# Patient Record
Sex: Female | Born: 1940 | Race: White | Hispanic: No | Marital: Married | Smoking: Never smoker
Health system: Southern US, Community
[De-identification: ages and names within clinical notes are randomized; demographics above are authoritative.]

## PROBLEM LIST (undated history)

## (undated) DIAGNOSIS — F32A Depression, unspecified: Secondary | ICD-10-CM

## (undated) DIAGNOSIS — IMO0002 Reserved for concepts with insufficient information to code with codable children: Secondary | ICD-10-CM

## (undated) DIAGNOSIS — Z923 Personal history of irradiation: Secondary | ICD-10-CM

## (undated) DIAGNOSIS — Z8719 Personal history of other diseases of the digestive system: Secondary | ICD-10-CM

## (undated) DIAGNOSIS — E7800 Pure hypercholesterolemia, unspecified: Secondary | ICD-10-CM

## (undated) DIAGNOSIS — E039 Hypothyroidism, unspecified: Secondary | ICD-10-CM

## (undated) DIAGNOSIS — R229 Localized swelling, mass and lump, unspecified: Secondary | ICD-10-CM

## (undated) DIAGNOSIS — K219 Gastro-esophageal reflux disease without esophagitis: Secondary | ICD-10-CM

## (undated) DIAGNOSIS — M1990 Unspecified osteoarthritis, unspecified site: Secondary | ICD-10-CM

## (undated) DIAGNOSIS — I209 Angina pectoris, unspecified: Secondary | ICD-10-CM

## (undated) DIAGNOSIS — F419 Anxiety disorder, unspecified: Secondary | ICD-10-CM

## (undated) DIAGNOSIS — R112 Nausea with vomiting, unspecified: Secondary | ICD-10-CM

## (undated) DIAGNOSIS — C50919 Malignant neoplasm of unspecified site of unspecified female breast: Secondary | ICD-10-CM

## (undated) DIAGNOSIS — F329 Major depressive disorder, single episode, unspecified: Secondary | ICD-10-CM

## (undated) DIAGNOSIS — I10 Essential (primary) hypertension: Secondary | ICD-10-CM

## (undated) DIAGNOSIS — Z9889 Other specified postprocedural states: Secondary | ICD-10-CM

## (undated) HISTORY — PX: TONSILLECTOMY: SUR1361

## (undated) HISTORY — DX: Localized swelling, mass and lump, unspecified: R22.9

## (undated) HISTORY — DX: Essential (primary) hypertension: I10

## (undated) HISTORY — PX: HEMORRHOID SURGERY: SHX153

## (undated) HISTORY — PX: BILATERAL KNEE ARTHROSCOPY: SUR91

## (undated) HISTORY — DX: Pure hypercholesterolemia, unspecified: E78.00

## (undated) HISTORY — DX: Hypothyroidism, unspecified: E03.9

## (undated) HISTORY — PX: JOINT REPLACEMENT: SHX530

## (undated) HISTORY — DX: Unspecified osteoarthritis, unspecified site: M19.90

## (undated) HISTORY — DX: Anxiety disorder, unspecified: F41.9

## (undated) HISTORY — DX: Reserved for concepts with insufficient information to code with codable children: IMO0002

## (undated) HISTORY — DX: Major depressive disorder, single episode, unspecified: F32.9

## (undated) HISTORY — PX: BACK SURGERY: SHX140

## (undated) HISTORY — PX: PARTIAL HYSTERECTOMY: SHX80

## (undated) HISTORY — PX: BREAST BIOPSY: SHX20

## (undated) HISTORY — DX: Gastro-esophageal reflux disease without esophagitis: K21.9

## (undated) HISTORY — DX: Depression, unspecified: F32.A

## (undated) HISTORY — PX: TONSILLECTOMY: SHX5217

---

## 1998-01-24 ENCOUNTER — Ambulatory Visit (HOSPITAL_COMMUNITY): Admission: RE | Admit: 1998-01-24 | Discharge: 1998-01-24 | Payer: Self-pay | Admitting: Cardiovascular Disease

## 1999-04-25 ENCOUNTER — Other Ambulatory Visit: Admission: RE | Admit: 1999-04-25 | Discharge: 1999-04-25 | Payer: Self-pay | Admitting: Obstetrics & Gynecology

## 2000-10-23 ENCOUNTER — Ambulatory Visit: Admission: RE | Admit: 2000-10-23 | Discharge: 2000-10-23 | Payer: Self-pay | Admitting: Specialist

## 2000-10-23 ENCOUNTER — Encounter: Payer: Self-pay | Admitting: Specialist

## 2000-11-26 ENCOUNTER — Encounter: Payer: Self-pay | Admitting: Specialist

## 2000-11-26 ENCOUNTER — Inpatient Hospital Stay (HOSPITAL_COMMUNITY): Admission: RE | Admit: 2000-11-26 | Discharge: 2000-11-30 | Payer: Self-pay | Admitting: Specialist

## 2001-04-13 ENCOUNTER — Other Ambulatory Visit: Admission: RE | Admit: 2001-04-13 | Discharge: 2001-04-13 | Payer: Self-pay | Admitting: Dermatology

## 2001-11-23 ENCOUNTER — Other Ambulatory Visit: Admission: RE | Admit: 2001-11-23 | Discharge: 2001-11-23 | Payer: Self-pay | Admitting: Dermatology

## 2002-02-26 ENCOUNTER — Encounter: Payer: Self-pay | Admitting: Otolaryngology

## 2002-02-28 ENCOUNTER — Emergency Department (HOSPITAL_COMMUNITY): Admission: EM | Admit: 2002-02-28 | Discharge: 2002-02-28 | Payer: Self-pay | Admitting: Emergency Medicine

## 2002-03-02 ENCOUNTER — Encounter: Payer: Self-pay | Admitting: Otolaryngology

## 2002-03-02 ENCOUNTER — Ambulatory Visit (HOSPITAL_COMMUNITY): Admission: RE | Admit: 2002-03-02 | Discharge: 2002-03-03 | Payer: Self-pay | Admitting: Otolaryngology

## 2002-04-13 ENCOUNTER — Encounter: Payer: Self-pay | Admitting: Specialist

## 2002-04-13 ENCOUNTER — Ambulatory Visit (HOSPITAL_COMMUNITY): Admission: RE | Admit: 2002-04-13 | Discharge: 2002-04-13 | Payer: Self-pay | Admitting: Specialist

## 2002-07-07 ENCOUNTER — Ambulatory Visit (HOSPITAL_COMMUNITY): Admission: RE | Admit: 2002-07-07 | Discharge: 2002-07-07 | Payer: Self-pay | Admitting: Specialist

## 2002-10-08 ENCOUNTER — Encounter: Payer: Self-pay | Admitting: Family Medicine

## 2002-10-08 ENCOUNTER — Encounter: Admission: RE | Admit: 2002-10-08 | Discharge: 2002-10-08 | Payer: Self-pay | Admitting: Family Medicine

## 2003-04-08 ENCOUNTER — Encounter: Payer: Self-pay | Admitting: Specialist

## 2003-04-09 ENCOUNTER — Inpatient Hospital Stay (HOSPITAL_COMMUNITY): Admission: RE | Admit: 2003-04-09 | Discharge: 2003-04-10 | Payer: Self-pay | Admitting: Specialist

## 2003-09-02 ENCOUNTER — Encounter: Admission: RE | Admit: 2003-09-02 | Discharge: 2003-09-02 | Payer: Self-pay | Admitting: Family Medicine

## 2003-09-07 ENCOUNTER — Other Ambulatory Visit: Admission: RE | Admit: 2003-09-07 | Discharge: 2003-09-07 | Payer: Self-pay | Admitting: Obstetrics & Gynecology

## 2004-05-08 ENCOUNTER — Encounter: Admission: RE | Admit: 2004-05-08 | Discharge: 2004-08-06 | Payer: Self-pay | Admitting: Family Medicine

## 2004-12-31 ENCOUNTER — Encounter: Admission: RE | Admit: 2004-12-31 | Discharge: 2004-12-31 | Payer: Self-pay | Admitting: Family Medicine

## 2005-08-07 ENCOUNTER — Encounter: Admission: RE | Admit: 2005-08-07 | Discharge: 2005-08-07 | Payer: Self-pay | Admitting: Family Medicine

## 2006-04-18 ENCOUNTER — Inpatient Hospital Stay (HOSPITAL_COMMUNITY): Admission: RE | Admit: 2006-04-18 | Discharge: 2006-04-22 | Payer: Self-pay | Admitting: Specialist

## 2006-05-20 ENCOUNTER — Encounter: Admission: RE | Admit: 2006-05-20 | Discharge: 2006-06-16 | Payer: Self-pay | Admitting: Specialist

## 2006-06-17 ENCOUNTER — Encounter: Admission: RE | Admit: 2006-06-17 | Discharge: 2006-09-09 | Payer: Self-pay | Admitting: Specialist

## 2006-10-20 ENCOUNTER — Encounter: Admission: RE | Admit: 2006-10-20 | Discharge: 2006-10-20 | Payer: Self-pay | Admitting: Family Medicine

## 2009-02-24 HISTORY — PX: CARDIOVASCULAR STRESS TEST: SHX262

## 2009-02-27 HISTORY — PX: US ECHOCARDIOGRAPHY: HXRAD669

## 2011-02-14 ENCOUNTER — Telehealth: Payer: Self-pay | Admitting: Cardiovascular Disease

## 2011-02-14 NOTE — Telephone Encounter (Signed)
Wants to come in for cardiac eval. She said that she had some sx's of chest burning and jaw pain in the past and she wants to get it evaluated.  The last time she was seen was in 2010. Dr.Nahser does not have any 30 min.openings until first week of Oct.

## 2011-02-14 NOTE — Telephone Encounter (Signed)
Pt given first same day dr Lauro Regulus is in office and told to go to er with anymore CP,Pt verbalized understanding. Corwin Levins RN

## 2011-02-15 ENCOUNTER — Encounter: Payer: Self-pay | Admitting: Cardiovascular Disease

## 2011-02-20 ENCOUNTER — Encounter: Payer: Self-pay | Admitting: Cardiovascular Disease

## 2011-02-20 ENCOUNTER — Ambulatory Visit (INDEPENDENT_AMBULATORY_CARE_PROVIDER_SITE_OTHER): Payer: Medicare Other | Admitting: Cardiovascular Disease

## 2011-02-20 DIAGNOSIS — I10 Essential (primary) hypertension: Secondary | ICD-10-CM

## 2011-02-20 DIAGNOSIS — R079 Chest pain, unspecified: Secondary | ICD-10-CM | POA: Insufficient documentation

## 2011-02-20 NOTE — Assessment & Plan Note (Signed)
Her blood pressure is a little elevated today. Her daughter commented that she still eats lots of salty foods. We will have her decrease her salt intake. We will continue with her same medications since the most time her blood pressure is normal.

## 2011-02-20 NOTE — Assessment & Plan Note (Signed)
She presents with some chest and jaw pain. It is quite possible that this represents angina. She's had a Lexiscan study in the past which was negative. Will repeat the myoview.

## 2011-02-20 NOTE — Progress Notes (Signed)
Laura Cruz Date of Birth  [birth year] [facility] [id number]     [place], [place]  [address] [phone]  Fax  [phone]  History of Present Illness:  Laura Cruz is a 70 year old female with a history of chest heaviness. Her original consultation was in September 2010 when she presented with chest heaviness. She had a Lexiscan Cardiolite which was negative for ischemia. She has normal left ventricular systolic function. She had an echocardiogram that revealed normal left ventricular systolic function. She has moderate to severe left ventricular hypertrophy. She has some diastolic dysfunction.  She recently developed an episode of jaw pain and chest pain. The pains subsided when she took the sublingual nitroglycerin.  Pains lasted about 15 minutes and resolved after she took a second nitroglycerin.  She was doing housework at the time.  She has a history of a hiatal hernia.  Her blood pressures typically in the normal range.  Her daughter came with her to the appointment today and stated that she still eats a lot of salt.  Current Outpatient Prescriptions on File Prior to Visit  Medication Sig Dispense Refill  . amLODipine (NORVASC) 2.5 MG tablet Take 2.5 mg by mouth daily.        Marland Kitchen aspirin 325 MG tablet Take 325 mg by mouth daily.        Marland Kitchen atenolol (TENORMIN) 50 MG tablet Take 50 mg by mouth daily.        Marland Kitchen buPROPion (WELLBUTRIN XL) 300 MG 24 hr tablet Take 300 mg by mouth daily.        Marland Kitchen estradiol (ESTRACE) 0.5 MG tablet Take 0.5 mg by mouth daily.        Marland Kitchen levothyroxine (SYNTHROID, LEVOTHROID) 125 MCG tablet Take 125 mcg by mouth daily.        Marland Kitchen lisinopril-hydrochlorothiazide (PRINZIDE,ZESTORETIC) 20-12.5 MG per tablet Take 1 tablet by mouth daily.        Marland Kitchen omeprazole (PRILOSEC) 20 MG capsule Take 20 mg by mouth daily.        . simvastatin (ZOCOR) 40 MG tablet Take 10 mg by mouth at bedtime.       . traMADol (ULTRAM) 50 MG tablet  Take 50 mg by mouth every 6 (six) hours as needed.        . traZODone (DESYREL) 100 MG tablet Take 100 mg by mouth at bedtime.       Marland Kitchen HYDROcodone-acetaminophen (NORCO) 7.5-325 MG per tablet Take 1 tablet by mouth every 6 (six) hours as needed.         Not on File  Past Medical History  Diagnosis Date  . Hypertension   . Diabetes mellitus   . Hypercholesteremia   . Arthritis   . Anxiety   . Depression   . Gastroesophageal reflux disease   . Hypothyroidism   . Hiatal hernia   . GERD (gastroesophageal reflux disease)   . Osteoarthritis   . Mass     Left ear, benign    Past Surgical History  Procedure Date  . Bilateral knee arthroscopy 2000, June 2007    Replacements  . Tonsillectomy   . Partial hysterectomy   . Hemorrhoid surgery   . Back surgery     2, pinched nerve  . US echocardiography 02-27-2009    Est EF 55-60%  . Cardiovascular stress test 02-24-2009    EF 68%    History  Smoking status  . Never Smoker   Smokeless tobacco  .  Not on file    History  Alcohol Use No    Family History  Problem Relation Age of Onset  . Diabetes Mother   . Hypertension Father   . Stroke Father   . Prostate cancer Father   . Coronary artery disease Father   . Hypertension Sister   . Hypertension Brother   . Hypertension Brother   . Lung cancer Sister     Reviw of Systems:  Reviewed in the HPI.  All other systems are negative.  Physical Exam: BP 164/88  Pulse 63  Ht 5\' 4"  (1.626 m)  Wt 148 lb 3.2 oz (67.223 kg)  BMI 25.44 kg/m2 The patient is alert and oriented x 3.  The mood and affect are normal.   Skin: warm and dry.  Color is normal.    HEENT:   the sclera are nonicteric.  The mucous membranes are moist.  The carotids are 2+ without bruits.  There is no thyromegaly.  There is no JVD.    Lungs: clear.  The chest wall is non tender.    Heart: regular rate with a normal S1 and S2.  There are no murmurs, gallops, or rubs. The PMI is not displaced.      Abdomen: good bowel sounds.  There is no guarding or rebound.  There is no hepatosplenomegaly or tenderness.  There are no masses.   Extremities:  no clubbing, cyanosis, or edema.  The legs are without rashes.  The distal pulses are intact.   Neuro:  Cranial nerves II - XII are intact.  Motor and sensory functions are intact.    The gait is normal.  ECG: Normal sinus rhythm. She has left ventricular hypertrophy with repolarization of her body. There are no changes from a previous EKG.  Assessment / Plan:

## 2011-02-25 ENCOUNTER — Encounter: Payer: Self-pay | Admitting: *Deleted

## 2011-03-05 ENCOUNTER — Ambulatory Visit (HOSPITAL_COMMUNITY): Payer: Medicare Other | Attending: Cardiovascular Disease | Admitting: Radiology

## 2011-03-05 DIAGNOSIS — R079 Chest pain, unspecified: Secondary | ICD-10-CM

## 2011-03-05 DIAGNOSIS — R0789 Other chest pain: Secondary | ICD-10-CM

## 2011-03-05 MED ORDER — REGADENOSON 0.4 MG/5ML IV SOLN
0.4000 mg | Freq: Once | INTRAVENOUS | Status: AC
  Administered 2011-03-05: 0.4 mg via INTRAVENOUS

## 2011-03-05 MED ORDER — TECHNETIUM TC 99M TETROFOSMIN IV KIT
11.0000 | PACK | Freq: Once | INTRAVENOUS | Status: AC | PRN
  Administered 2011-03-05: 11 via INTRAVENOUS

## 2011-03-05 MED ORDER — TECHNETIUM TC 99M TETROFOSMIN IV KIT
33.0000 | PACK | Freq: Once | INTRAVENOUS | Status: AC | PRN
  Administered 2011-03-05: 33 via INTRAVENOUS

## 2011-03-05 NOTE — Progress Notes (Signed)
[place] [address] [phone]  Cardiology Nuclear Med Study  Laura Cruz is a 70 y.o. female [id number] [birth year]   Nuclear Med Background Indication for Stress Test:  Evaluation for Ischemia History:  '10 [id number], EF=68%; '10 Echo:EF=55-60% Cardiac Risk Factors: Family History - CAD, Hypertension, Lipids and NIDDM  Symptoms:  Chest Pain/Pressure and Jaw Pain with and without Exertion (last episode of chest discomfort was about 2-3 weeks ago), Diaphoresis, Fatigue and Rapid HR   Nuclear Pre-Procedure Caffeine/Decaff Intake:  none NPO After: 6:00pm   Lungs:  Clear.  O2 Sat 97% on RA. IV 0.9% NS with Angio Cath:  20g  IV Site: R Forearm  IV Started by:  Eliezer Lofts, EMT-P  Chest Size (in):  42 Cup Size: D  Height:    Weight:     BMI:  There is no height or weight on file to calculate BMI. Tech Comments:  Atenolol held x 48 hours    Nuclear Med Study 1 or 2 day study: 1 day  Stress Test Type:  Carlton Adam  Reading MD: Mertie Moores, MD  Order Authorizing Provider:  Mertie Moores, MD  Resting Radionuclide: Technetium 49m Tetrofosmin  Resting Radionuclide Dose: 11.0 mCi   Stress Radionuclide:  Technetium 34m Tetrofosmin  Stress Radionuclide Dose: 33.0 mCi           Stress Protocol Rest HR: 69 Stress HR: 90  Rest BP: 163/86 Stress BP: 160/92  Exercise Time (min): n/a METS: n/a   Predicted Max HR: 149 bpm % Max HR: 60.4 bpm Rate Pressure Product: 14400   Dose of Adenosine (mg):  n/a Dose of Lexiscan: 0.4 mg  Dose of Atropine (mg): n/a Dose of Dobutamine: n/a mcg/kg/min (at max HR)  Stress Test Technologist: Letta Moynahan, CMA-N  Nuclear Technologist:  Charlton Amor, CNMT     Rest Procedure:  Myocardial perfusion imaging was performed at rest 45 minutes following the intravenous administration of Technetium 59m Tetrofosmin.  Rest ECG: Mild LVH with strain.  Stress Procedure:  The patient  received IV Lexiscan 0.4 mg over 15-seconds.  Technetium 56m Tetrofosmin injected at 30-seconds.  There were no significant changes with Lexiscan, occasional PVC's.  She did c/o chest tightness with infusion.  Quantitative spect images were obtained after a 45 minute delay.  Stress ECG: No significant change from baseline ECG  QPS Raw Data Images:  Normal; no motion artifact; normal heart/lung ratio. Stress Images:  There is mild apical thinning with normal uptake in other regions. Rest Images:  There is mild apical thinning with normal uptake in other regions. Subtraction (SDS):  No evidence of ischemia. Transient Ischemic Dilatation (Normal <1.22):  1.21 Lung/Heart Ratio (Normal <0.45):  0.27  Quantitative Gated Spect Images QGS EDV:  63 ml QGS ESV:  23 ml QGS cine images:  NL LV Function; NL Wall Motion QGS EF: 63%  Impression Exercise Capacity:  Lexiscan with no exercise. BP Response:  Normal blood pressure response. Clinical Symptoms:  No chest pain. ECG Impression:  No significant ST segment change suggestive of ischemia. Comparison with Prior Nuclear Study: No images to compare  Overall Impression:  Normal stress nuclear study.  No evidence of ischemia.  Normal LV function.    Thayer Headings, Laura Bonito., MD, [facility]

## 2011-04-05 ENCOUNTER — Ambulatory Visit: Payer: Medicare Other | Admitting: Cardiovascular Disease

## 2011-04-26 ENCOUNTER — Encounter: Payer: Self-pay | Admitting: Cardiovascular Disease

## 2011-04-26 ENCOUNTER — Ambulatory Visit (INDEPENDENT_AMBULATORY_CARE_PROVIDER_SITE_OTHER): Payer: Medicare Other | Admitting: Cardiovascular Disease

## 2011-04-26 DIAGNOSIS — R079 Chest pain, unspecified: Secondary | ICD-10-CM

## 2011-04-26 NOTE — Patient Instructions (Signed)
Your physician wants you to follow-up in: 6 month  You will receive a reminder letter in the mail two months in advance. If you don't receive a letter, please call our office to schedule the follow-up appointment.  Try maylanta for abd/chest pain, call office if no help.

## 2011-04-26 NOTE — Progress Notes (Signed)
Laura Cruz Date of Birth  [birth year] [facility] [address]    [place], [place]  [address] [phone]  Fax  [phone]  History of Present Illness:  Laura Cruz is a 70 year old female with a history of chest pains in the past. She has a hiatal hernia. Had a negative Myoview study back in 2010 and repeat normal Myoview study 2000 well. She continues to have intermittent episodes of chest discomfort. The pain starts as a burning on the right side of her chest.  She had an episode last night. She took a nitroglycerin and the pressure gradually eased up over the course of about 15 minutes.  She walks on a regular basis.  She's never had any episodes of angina when she walks. She does have some shortness of breath with exertion.  Current Outpatient Prescriptions on File Prior to Visit  Medication Sig Dispense Refill  . amLODipine (NORVASC) 2.5 MG tablet Take 2.5 mg by mouth daily.        Marland Kitchen aspirin 325 MG tablet Take 325 mg by mouth daily.        Marland Kitchen atenolol (TENORMIN) 50 MG tablet Take 50 mg by mouth daily.        Marland Kitchen buPROPion (WELLBUTRIN XL) 300 MG 24 hr tablet Take 300 mg by mouth daily.        Marland Kitchen estradiol (ESTRACE) 0.5 MG tablet Take 0.5 mg by mouth daily.        Marland Kitchen glucose blood test strip 1 each by Other route. Use as instructed       . Lancets Misc. (UNISTIK 2 NORMAL) MISC by Does not apply route. Lancet Once a Day DX 250.00       . levothyroxine (SYNTHROID, LEVOTHROID) 125 MCG tablet Take 125 mcg by mouth daily.        Marland Kitchen lisinopril-hydrochlorothiazide (PRINZIDE,ZESTORETIC) 20-12.5 MG per tablet Take 1 tablet by mouth daily.        . nitroGLYCERIN (NITROSTAT) 0.4 MG SL tablet Place 0.4 mg under the tongue every 5 (five) minutes as needed.        Marland Kitchen omeprazole (PRILOSEC) 20 MG capsule Take 20 mg by mouth daily.        . OXYCODONE HCL PO Take 10 mg by mouth as needed.        . sertraline (ZOLOFT) 100 MG tablet Take 100 mg by mouth daily.        . traMADol (ULTRAM) 50  MG tablet Take 50 mg by mouth every 6 (six) hours as needed.        . traZODone (DESYREL) 100 MG tablet Take 100 mg by mouth at bedtime.         Allergies  Allergen Reactions  . Morphine And Related     Past Medical History  Diagnosis Date  . Hypertension   . Diabetes mellitus   . Hypercholesteremia   . Arthritis   . Anxiety   . Depression   . Gastroesophageal reflux disease   . Hypothyroidism   . Hiatal hernia   . GERD (gastroesophageal reflux disease)   . Osteoarthritis   . Mass     Left ear, benign    Past Surgical History  Procedure Date  . Bilateral knee arthroscopy 2000, June 2007    Replacements  . Tonsillectomy   . Partial hysterectomy   . Hemorrhoid surgery   . Back surgery     2, pinched nerve  . US echocardiography 02-27-2009    Est EF  55-60%  . Cardiovascular stress test 02-24-2009    EF 68%    History  Smoking status  . Never Smoker   Smokeless tobacco  . Not on file    History  Alcohol Use No    Family History  Problem Relation Age of Onset  . Diabetes Mother   . Hypertension Father   . Stroke Father   . Prostate cancer Father   . Coronary artery disease Father   . Hypertension Sister   . Hypertension Brother   . Hypertension Brother   . Lung cancer Sister     Reviw of Systems:  Reviewed in the HPI.  All other systems are negative.  Physical Exam: BP 153/77  Pulse 64  Ht 5\' 4"  (1.626 m)  Wt 151 lb 12.8 oz (68.856 kg)  BMI 26.06 kg/m2 The patient is alert and oriented x 3.  The mood and affect are normal.   Skin: warm and dry.  Color is normal.    HEENT:   [place]/AT, no JVD, normal carotids  Lungs: clear   Heart: RR    Abdomen: +BS, nontender  Extremities:  No c/c/e  Neuro:  Non focal    Assessment / Plan:

## 2011-04-26 NOTE — Assessment & Plan Note (Addendum)
Laura Cruz continues to have chest pain.  She has had 2 normal myoview studies.  She has a hiatal hernia.  I have suggested that she try mylanta for her next episode of CP. I have offered to do heart catheterization if she continues to have episodes of chest pain.

## 2011-06-21 ENCOUNTER — Other Ambulatory Visit: Payer: Self-pay | Admitting: Family Medicine

## 2011-06-21 DIAGNOSIS — R222 Localized swelling, mass and lump, trunk: Secondary | ICD-10-CM

## 2011-06-24 ENCOUNTER — Ambulatory Visit
Admission: RE | Admit: 2011-06-24 | Discharge: 2011-06-24 | Disposition: A | Discharge status: Home / Self Care | Payer: Medicare Other | Source: Ambulatory Visit | Attending: Family Medicine | Admitting: Family Medicine

## 2011-06-24 DIAGNOSIS — R222 Localized swelling, mass and lump, trunk: Secondary | ICD-10-CM

## 2011-07-22 ENCOUNTER — Other Ambulatory Visit: Payer: Self-pay | Admitting: Otolaryngology

## 2011-07-22 DIAGNOSIS — M542 Cervicalgia: Secondary | ICD-10-CM

## 2011-07-25 ENCOUNTER — Ambulatory Visit
Admission: RE | Admit: 2011-07-25 | Discharge: 2011-07-25 | Disposition: A | Discharge status: Home / Self Care | Payer: Medicare Other | Source: Ambulatory Visit | Attending: Otolaryngology | Admitting: Otolaryngology

## 2011-07-25 DIAGNOSIS — M542 Cervicalgia: Secondary | ICD-10-CM

## 2011-07-25 MED ORDER — IOHEXOL 300 MG/ML  SOLN
75.0000 mL | Freq: Once | INTRAMUSCULAR | Status: AC | PRN
  Administered 2011-07-25: 75 mL via INTRAVENOUS

## 2011-10-01 ENCOUNTER — Other Ambulatory Visit: Payer: Self-pay | Admitting: Obstetrics & Gynecology

## 2011-10-01 DIAGNOSIS — R928 Other abnormal and inconclusive findings on diagnostic imaging of breast: Secondary | ICD-10-CM

## 2011-10-08 ENCOUNTER — Ambulatory Visit
Admission: RE | Admit: 2011-10-08 | Discharge: 2011-10-08 | Disposition: A | Discharge status: Home / Self Care | Payer: Medicare Other | Source: Ambulatory Visit | Attending: Obstetrics & Gynecology | Admitting: Obstetrics & Gynecology

## 2011-10-08 ENCOUNTER — Other Ambulatory Visit: Payer: Self-pay | Admitting: Obstetrics & Gynecology

## 2011-10-08 DIAGNOSIS — R928 Other abnormal and inconclusive findings on diagnostic imaging of breast: Secondary | ICD-10-CM

## 2011-10-08 DIAGNOSIS — N6320 Unspecified lump in the left breast, unspecified quadrant: Secondary | ICD-10-CM

## 2011-10-14 ENCOUNTER — Ambulatory Visit
Admission: RE | Admit: 2011-10-14 | Discharge: 2011-10-14 | Disposition: A | Discharge status: Home / Self Care | Payer: Medicare Other | Source: Ambulatory Visit | Attending: Obstetrics & Gynecology | Admitting: Obstetrics & Gynecology

## 2011-10-14 ENCOUNTER — Other Ambulatory Visit: Payer: Self-pay | Admitting: Obstetrics & Gynecology

## 2011-10-14 DIAGNOSIS — N6320 Unspecified lump in the left breast, unspecified quadrant: Secondary | ICD-10-CM

## 2011-10-15 ENCOUNTER — Other Ambulatory Visit: Payer: Self-pay | Admitting: Obstetrics & Gynecology

## 2011-10-15 ENCOUNTER — Ambulatory Visit
Admission: RE | Admit: 2011-10-15 | Discharge: 2011-10-15 | Disposition: A | Discharge status: Home / Self Care | Payer: Medicare Other | Source: Ambulatory Visit | Attending: Obstetrics & Gynecology | Admitting: Obstetrics & Gynecology

## 2011-10-15 DIAGNOSIS — C50912 Malignant neoplasm of unspecified site of left female breast: Secondary | ICD-10-CM

## 2011-10-15 DIAGNOSIS — N6320 Unspecified lump in the left breast, unspecified quadrant: Secondary | ICD-10-CM

## 2011-10-16 DIAGNOSIS — C50919 Malignant neoplasm of unspecified site of unspecified female breast: Secondary | ICD-10-CM

## 2011-10-16 HISTORY — DX: Malignant neoplasm of unspecified site of unspecified female breast: C50.919

## 2011-10-21 ENCOUNTER — Ambulatory Visit
Admission: RE | Admit: 2011-10-21 | Discharge: 2011-10-21 | Disposition: A | Discharge status: Home / Self Care | Payer: Medicare Other | Source: Ambulatory Visit | Attending: Obstetrics & Gynecology | Admitting: Obstetrics & Gynecology

## 2011-10-21 DIAGNOSIS — C50912 Malignant neoplasm of unspecified site of left female breast: Secondary | ICD-10-CM

## 2011-10-21 MED ORDER — GADOBENATE DIMEGLUMINE 529 MG/ML IV SOLN
14.0000 mL | Freq: Once | INTRAVENOUS | Status: AC | PRN
  Administered 2011-10-21: 14 mL via INTRAVENOUS

## 2011-10-23 ENCOUNTER — Other Ambulatory Visit: Payer: Self-pay | Admitting: Oncology

## 2011-10-23 ENCOUNTER — Encounter: Payer: Self-pay | Admitting: Specialist

## 2011-10-23 ENCOUNTER — Ambulatory Visit
Admission: RE | Admit: 2011-10-23 | Discharge: 2011-10-23 | Disposition: A | Discharge status: Home / Self Care | Payer: Medicare Other | Source: Ambulatory Visit | Attending: Radiation Oncology | Admitting: Radiation Oncology

## 2011-10-23 ENCOUNTER — Encounter (INDEPENDENT_AMBULATORY_CARE_PROVIDER_SITE_OTHER): Payer: Self-pay | Admitting: Surgery

## 2011-10-23 ENCOUNTER — Encounter: Payer: Self-pay | Admitting: Radiation Oncology

## 2011-10-23 ENCOUNTER — Encounter: Payer: Self-pay | Admitting: *Deleted

## 2011-10-23 ENCOUNTER — Telehealth: Payer: Self-pay | Admitting: *Deleted

## 2011-10-23 ENCOUNTER — Other Ambulatory Visit (HOSPITAL_BASED_OUTPATIENT_CLINIC_OR_DEPARTMENT_OTHER): Payer: Medicare Other | Admitting: Lab

## 2011-10-23 ENCOUNTER — Ambulatory Visit (HOSPITAL_BASED_OUTPATIENT_CLINIC_OR_DEPARTMENT_OTHER): Payer: Medicare Other | Admitting: Surgery

## 2011-10-23 ENCOUNTER — Ambulatory Visit: Payer: Medicare Other | Attending: Surgery | Admitting: Physical Therapy

## 2011-10-23 ENCOUNTER — Ambulatory Visit: Payer: Medicare Other

## 2011-10-23 ENCOUNTER — Encounter: Payer: Self-pay | Admitting: Oncology

## 2011-10-23 ENCOUNTER — Ambulatory Visit (HOSPITAL_BASED_OUTPATIENT_CLINIC_OR_DEPARTMENT_OTHER): Payer: Medicare Other | Admitting: Oncology

## 2011-10-23 VITALS — BP 151/62 | HR 71 | Temp 97.9°F | Ht 62.5 in | Wt 156.5 lb

## 2011-10-23 DIAGNOSIS — K219 Gastro-esophageal reflux disease without esophagitis: Secondary | ICD-10-CM | POA: Insufficient documentation

## 2011-10-23 DIAGNOSIS — E119 Type 2 diabetes mellitus without complications: Secondary | ICD-10-CM | POA: Insufficient documentation

## 2011-10-23 DIAGNOSIS — E7800 Pure hypercholesterolemia, unspecified: Secondary | ICD-10-CM | POA: Insufficient documentation

## 2011-10-23 DIAGNOSIS — Z79899 Other long term (current) drug therapy: Secondary | ICD-10-CM | POA: Insufficient documentation

## 2011-10-23 DIAGNOSIS — Z510 Encounter for antineoplastic radiation therapy: Secondary | ICD-10-CM | POA: Insufficient documentation

## 2011-10-23 DIAGNOSIS — C50519 Malignant neoplasm of lower-outer quadrant of unspecified female breast: Secondary | ICD-10-CM

## 2011-10-23 DIAGNOSIS — Z90710 Acquired absence of both cervix and uterus: Secondary | ICD-10-CM | POA: Insufficient documentation

## 2011-10-23 DIAGNOSIS — C50912 Malignant neoplasm of unspecified site of left female breast: Secondary | ICD-10-CM | POA: Insufficient documentation

## 2011-10-23 DIAGNOSIS — I10 Essential (primary) hypertension: Secondary | ICD-10-CM | POA: Insufficient documentation

## 2011-10-23 DIAGNOSIS — IMO0001 Reserved for inherently not codable concepts without codable children: Secondary | ICD-10-CM | POA: Insufficient documentation

## 2011-10-23 DIAGNOSIS — Z801 Family history of malignant neoplasm of trachea, bronchus and lung: Secondary | ICD-10-CM

## 2011-10-23 DIAGNOSIS — R293 Abnormal posture: Secondary | ICD-10-CM | POA: Insufficient documentation

## 2011-10-23 DIAGNOSIS — C50919 Malignant neoplasm of unspecified site of unspecified female breast: Secondary | ICD-10-CM

## 2011-10-23 DIAGNOSIS — Z170 Estrogen receptor positive status [ER+]: Secondary | ICD-10-CM

## 2011-10-23 LAB — CBC WITH DIFFERENTIAL/PLATELET
Basophils Absolute: 0 10*3/uL (ref 0.0–0.1)
EOS%: 4.3 % (ref 0.0–7.0)
HCT: 36.4 % (ref 34.8–46.6)
HGB: 12.6 g/dL (ref 11.6–15.9)
MCH: 28.2 pg (ref 25.1–34.0)
MCV: 81.4 fL (ref 79.5–101.0)
MONO%: 9.5 % (ref 0.0–14.0)
NEUT%: 64.8 % (ref 38.4–76.8)
lymph#: 1.2 10*3/uL (ref 0.9–3.3)

## 2011-10-23 LAB — COMPREHENSIVE METABOLIC PANEL
AST: 23 U/L (ref 0–37)
Alkaline Phosphatase: 53 U/L (ref 39–117)
BUN: 20 mg/dL (ref 6–23)
Creatinine, Ser: 1.39 mg/dL — ABNORMAL HIGH (ref 0.50–1.10)

## 2011-10-23 NOTE — Telephone Encounter (Signed)
gave patient appointment for genetics counseling and one month appointment with dr.khan

## 2011-10-23 NOTE — Progress Notes (Signed)
Patient came in today as a new patient , patient has insurance at this time patient did not request or need any assistance patient will contact financial advocated if anything changes.

## 2011-10-23 NOTE — Progress Notes (Signed)
I met the patient and her family at the multidisciplinary clinic.  We reviewed the distress screening; most of her distress had been addressed by being at the clinic and receiving information about her treatment.  I told her about the support services available at the [facility] for Patient and Family Support.

## 2011-10-23 NOTE — Patient Instructions (Signed)
Lumpectomy, Breast Conserving Surgery A lumpectomy is breast surgery that removes only part of the breast. Another name used may be partial mastectomy. The amount removed varies. Make sure you understand how much of your breast will be removed. Reasons for a lumpectomy:  Any solid breast mass.   Grouped significant nodularity that may be confused with a solitary breast mass.  Lumpectomy is the most common form of breast cancer surgery today. The surgeon removes the portion of your breast which contains the tumor (cancer). This is the lump. Some normal tissue around the lump is also removed to be sure that all the tumor has been removed.  If cancer cells are found in the margins where the breast tissue was removed, your surgeon will do more surgery to remove the remaining cancer tissue. This is called re-excision surgery. Radiation and/or chemotherapy treatments are often given following a lumpectomy to kill any cancer cells that could possibly remain.  REASONS YOU MAY NOT BE ABLE TO HAVE BREAST CONSERVING SURGERY:  The tumor is located in more than one place.   Your breast is small and the tumor is large so the breast would be disfigured.   The entire tumor removal is not successful with a lumpectomy.   You cannot commit to a full course of chemotherapy, radiation therapy or are pregnant and cannot have radiation.   You have previously had radiation to the breast to treat cancer.  HOW A LUMPECTOMY IS PERFORMED If overnight nursing is not required following a biopsy, a lumpectomy can be performed as a same-day surgery. This can be done in a hospital, clinic, or surgical center. The anesthesia used will depend on your surgeon. They will discuss this with you. A general anesthetic keeps you sleeping through the procedure. LET YOUR CAREGIVERS KNOW ABOUT THE FOLLOWING:  Allergies   Medications taken including herbs, eye drops, over the counter medications, and creams.   Use of steroids (by  mouth or creams)   Previous problems with anesthetics or Novocaine.   Possibility of pregnancy, if this applies   History of blood clots (thrombophlebitis)   History of bleeding or blood problems.   Previous surgery   Other health problems  BEFORE THE PROCEDURE You should be present one hour prior to your procedure unless directed otherwise.  AFTER THE PROCEDURE  After surgery, you will be taken to the recovery area where a nurse will watch and check your progress. Once you're awake, stable, and taking fluids well, barring other problems you will be allowed to go home.   Ice packs applied to your operative site may help with discomfort and keep the swelling down.   A small rubber drain may be placed in the breast for a couple of days to prevent a hematoma from developing in the breast.   A pressure dressing may be applied for 24 to 48 hours to prevent bleeding.   Keep the wound dry.   You may resume a normal diet and activities as directed. Avoid strenuous activities affecting the arm on the side of the biopsy site such as tennis, swimming, heavy lifting (more than 10 pounds) or pulling.   Bruising in the breast is normal following this procedure.   Wearing a bra - even to bed - may be more comfortable and also help keep the dressing on.   Change dressings as directed.   Only take over-the-counter or prescription medicines for pain, discomfort, or fever as directed by your caregiver.  Call for your results as  instructed by your surgeon. Remember it is your responsibility to get the results of your lumpectomy if your surgeon asked you to follow-up. Do not assume everything is fine if you have not heard from your caregiver. SEEK MEDICAL CARE IF:   There is increased bleeding (more than a small spot) from the wound.   You notice redness, swelling, or increasing pain in the wound.   Pus is coming from wound.   An unexplained oral temperature above 102 F (38.9 C) develops.     You notice a foul smell coming from the wound or dressing.  SEEK IMMEDIATE MEDICAL CARE IF:   You develop a rash.   You have difficulty breathing.   You have any allergic problems.  Document Released: 07/15/2006 Document Revised: 05/23/2011 Document Reviewed: 10/16/2006 [facility] Patient Information 2012 [place].

## 2011-10-23 NOTE — Progress Notes (Signed)
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 Ki-67 3%  HISTORY OF PRESENT ILLNESS::Laura Cruz is a 71 y.o. female who was found on screening mammography to have a 9 mm lesion in her left breast. There was 5 mm on ultrasound. This was biopsied and the tissue demonstrated pathology as described above in her diagnosis. She underwent an MRI which showed a solitary 11 mm mass in the lower outer quadrant of the left breast. She is otherwise in her usual state of health.   PREVIOUS RADIATION THERAPY: No  PAST MEDICAL HISTORY:  has a past medical history of Hypertension; Diabetes mellitus; Hypercholesteremia; Arthritis; Anxiety; Depression; Gastroesophageal reflux disease; Hypothyroidism; Hiatal hernia; GERD (gastroesophageal reflux disease); Osteoarthritis; and Mass.  she reports a history of skin cancer over her nose which was excised in the past  PAST SURGICAL HISTORY: Past Surgical History  Procedure Date  . Bilateral knee arthroscopy 2000, June 2007    Replacements  . Tonsillectomy   . Partial hysterectomy   . Hemorrhoid surgery   . Back surgery     2, pinched nerve  . US echocardiography 02-27-2009    Est EF 55-60%  . Cardiovascular stress test 02-24-2009    EF 68%   Gynecologic history: the patient underwent hysterectomy at age 56 for heavy bleeding. She started having her menstrual cycles at age 70. She has had 2 children carried to term. She was on hormone replacement therapy for 15 years and stopped this on 10/16/2011.  FAMILY HISTORY: family history includes Coronary artery disease in her father; Diabetes in her mother; Hypertension in her brothers,  father, and sister; Lung cancer in her sister; Prostate cancer in her father; and Stroke in her father.  SOCIAL HISTORY:  reports that she has never smoked. She does not have any smokeless tobacco history on file. She reports that she does not drink alcohol or use illicit drugs.  ALLERGIES: Morphine and related and Cephalexin  MEDICATIONS:  Current Outpatient Prescriptions  Medication Sig Dispense Refill  . amLODipine (NORVASC) 2.5 MG tablet Take 2.5 mg by mouth daily.        Marland Kitchen aspirin 325 MG tablet Take 325 mg by mouth daily.        Marland Kitchen atenolol (TENORMIN) 50 MG tablet Take 50 mg by mouth daily.        Marland Kitchen atorvastatin (LIPITOR) 10 MG tablet Take 10 mg by mouth daily.        Marland Kitchen buPROPion (WELLBUTRIN XL) 300 MG 24 hr tablet Take 300 mg by mouth daily.        . cyclobenzaprine (FLEXERIL) 10 MG tablet Take 10 mg by mouth 3 (three) times daily as needed.      Marland Kitchen estradiol (ESTRACE) 0.5 MG tablet Take 0.5 mg by mouth daily.       Marland Kitchen glucose blood test strip 1 each by Other route. Use as instructed       . HYDROcodone-acetaminophen (NORCO) 7.5-325 MG per tablet Take 1 tablet by mouth every 8 (eight) hours as needed.      Marland Kitchen HYDROcodone-homatropine (HYCODAN) 5-1.5 MG/5ML syrup Take 5 mLs by mouth every 6 (six) hours as needed.      Marland Kitchen  Lancets Misc. (UNISTIK 2 NORMAL) MISC by Does not apply route. Lancet Once a Day DX 250.00       . levothyroxine (SYNTHROID, LEVOTHROID) 125 MCG tablet Take 125 mcg by mouth daily.        Marland Kitchen lisinopril-hydrochlorothiazide (PRINZIDE,ZESTORETIC) 20-12.5 MG per tablet Take 1 tablet by mouth daily.        . nitroGLYCERIN (NITROSTAT) 0.4 MG SL tablet Place 0.4 mg under the tongue every 5 (five) minutes as needed.        Marland Kitchen omeprazole (PRILOSEC) 20 MG capsule Take 20 mg by mouth daily.        . OXYCODONE HCL PO Take 10 mg by mouth every 6 (six) hours as needed.       . sertraline (ZOLOFT) 100 MG tablet Take 100 mg by mouth daily.        . simvastatin (ZOCOR) 10 MG tablet Take 10 mg  by mouth at bedtime.      . traMADol (ULTRAM) 50 MG tablet Take 50 mg by mouth every 6 (six) hours as needed.        . traZODone (DESYREL) 100 MG tablet Take 100 mg by mouth at bedtime.         REVIEW OF SYSTEMS:  A complete 14 point review of systems was obtained and reviewed with the patient. It is notable for that above.    PHYSICAL EXAM:  Blood pressure 131/62. Pulse 71. Temperature 97.9 degrees. Weight 156 pounds. 5 foot 2.5 inches. General: Alert and oriented, in no acute distress HEENT: Head is normocephalic. Pupils are equally round and reactive to light. Extraocular movements are intact. Oropharynx is clear. Neck: Neck is supple, no palpable cervical or supraclavicular lymphadenopathy. Heart: Regular in rate and rhythm with no murmurs, rubs, or gallops. Chest: Clear to auscultation bilaterally, with no rhonchi, wheezes, or rales. Abdomen: Soft, nontender, nondistended, with no rigidity or guarding. Extremities: No cyanosis or edema. Lymphatics: No concerning lymphadenopathy. Skin: No concerning lesions. Musculoskeletal: symmetric strength and muscle tone throughout. Neurologic: Cranial nerves II through XII are grossly intact. No obvious focalities. Speech is fluent. Coordination is intact. Psychiatric: Judgment and insight are intact. Affect is appropriate. Breasts: There is a palpable post biopsy hematoma in the lower outer quadrant of the left breast. There is associated ecchymosis throughout the lower outer quadrant. No other palpable lesions of concern in either breast nor any palpable adenopathy in the axillary region  LABORATORY DATA:  Lab Results  Component Value Date   WBC 5.8 10/23/2011   HGB 12.6 10/23/2011   HCT 36.4 10/23/2011   MCV 81.4 10/23/2011   PLT 127* 10/23/2011   CMP     Component Value Date/Time   NA 141 10/23/2011 1302   K 3.3* 10/23/2011 1302   CL 100 10/23/2011 1302   CO2 26 10/23/2011 1302   GLUCOSE 116* 10/23/2011 1302   BUN 20 10/23/2011 1302   CREATININE  1.39* 10/23/2011 1302   CALCIUM 9.5 10/23/2011 1302   PROT 6.6 10/23/2011 1302   ALBUMIN 4.0 10/23/2011 1302   AST 23 10/23/2011 1302   ALT 19 10/23/2011 1302   ALKPHOS 53 10/23/2011 1302   BILITOT 0.3 10/23/2011 1302    Pathology: as above    RADIOGRAPHY: US Breast Left  10/08/2011  *RADIOLOGY REPORT*  Clinical Data:  The patient returns for evaluation of a possible mass in the left lower outer quadrant noted on recent screening study dated 09/30/2011 from [place].  DIGITAL DIAGNOSTIC LEFT MAMMOGRAM  AND LEFT BREAST ULTRASOUND:  Comparison:  04/18/2010 and 03/29/2009 from [facility]  Findings:  Additional views confirm the presence of a small spiculated mass in the left lower outer quadrant posteriorly. Mammographically this measures approximately 9 x 6 x 9 mm.  On physical exam, no mass is palpated in the left lower outer quadrant  Ultrasound is performed, showing an irregularly marginated taller than wide solid mass at 4 o'clock 10 cm from the left nipple measuring 5 mm in size.  This has a hyperechoic rim.  Sonography of the left axilla demonstrates no abnormal axillary nodes.  The appearance is suspicious for invasive mammary carcinoma. Biopsy is recommended.  Ultrasound-guided core needle biopsy was discussed with the patient and she agreed with this plan.  IMPRESSION: Highly suspicious spiculated mass in the left lower outer quadrant at 4 o'clock.  Ultrasound-guided core needle biopsy is recommended and has been scheduled for 10/14/2011.  Results were telephoned to [place] at Dr. Gwynne Edinger office.  BI-RADS CATEGORY 5:  Highly suggestive of malignancy - appropriate action should be taken.  Original Report Authenticated By: Chaney Born, M.D.   Mr Breast Bilateral W Wo Contrast  10/21/2011  *RADIOLOGY REPORT*  Clinical Data: Recently diagnosed invasive ductal carcinoma, grade 1 in the 4 o'clock region of the left breast.  BILATERAL BREAST MRI WITH AND WITHOUT CONTRAST  Technique:  Multiplanar, multisequence MR images of both breasts were obtained prior to and following the intravenous administration of 70ml of multihance.  Three dimensional images were evaluated at the independent DynaCad workstation.  Comparison:  Mammograms dated 09/30/2011,  10/08/2011, 10/14/2011 and 04/18/2010  Findings: There is a moderate background parenchymal enhancement pattern.  The patient has a 2.6 cm post biopsy hematoma in the lower outer quadrant of the left breast with a thin rim of surrounding enhancement. This corresponds well with the post biopsy mammogram. Just lateral to the hematoma is an enhancing nodule measuring 10 x 7 x 11 mm associated with a clip artifact.  This corresponds with the known malignancy.  No other areas of abnormal enhancement are seen in the left breast.  There is no abnormal enhancement the right breast.  There is no enlarged axillary or internal mammary adenopathy.  IMPRESSION: 11 mm enhancing mass in the lower outer quadrant of the left breast corresponding well with the known malignancy.  Post biopsy hematoma.  Treatment planning is recommended.  THREE-DIMENSIONAL MR IMAGE RENDERING ON INDEPENDENT WORKSTATION:  Three-dimensional MR images were rendered by post-processing of the original MR data on an independent workstation.  The three- dimensional MR images were interpreted, and findings were reported in the accompanying complete MRI report for this study.  BI-RADS CATEGORY 6:  Known biopsy-proven malignancy - appropriate action should be taken.   .  Original Report Authenticated By: Jeanie Cooks. Miquel Dunn, M.D.   [place] Core Biopsy  10/14/2011  *RADIOLOGY REPORT*  Clinical Data:  Suspicious left breast mass  ULTRASOUND GUIDED VACUUM ASSISTED CORE BIOPSY OF THE LEFT BREAST  Comparison: With priors  I met with the patient and we discussed the procedure of ultrasound- guided biopsy, including benefits and alternatives.  We discussed the high likelihood of a successful procedure. We  discussed the risks of the procedure, including infection, bleeding, tissue injury, clip migration, and inadequate sampling.  Informed, written consent was given.  Using sterile technique, 2% lidocaine, ultrasound guidance, and a 12 gauge vacuum assisted needle, biopsy was performed of the mass in the 4 o'clock region of the left breast.  At the conclusion of the procedure, a tissue marker clip was deployed into the biopsy cavity.  Follow-up 2-view mammogram was performed and dictated separately.  IMPRESSION: Ultrasound-guided biopsy of the left breast.  No apparent complications.  Original Report Authenticated By: Jeanie Cooks. Miquel Dunn, M.D.   Mm Digital Diagnostic Unilat L  10/14/2011  *RADIOLOGY REPORT*  Clinical Data:  Suspicious left breast mass  DIGITAL DIAGNOSTIC LEFT MAMMOGRAM  Comparison:  With priors  Findings:  Films are performed following ultrasound guided biopsy of a mass in the 4 o'clock region of the left breast.  Mammographic images demonstrate there is a ribbon shaped InRad clip in the lateral aspect of the left breast.  IMPRESSION: Status post ultrasound-guided core biopsy of the left breast mass with pathology pending.  Original Report Authenticated By: Jeanie Cooks. Miquel Dunn, M.D.   Mm Digital Diagnostic Unilat L  10/08/2011  *RADIOLOGY REPORT*  Clinical Data:  The patient returns for evaluation of a possible mass in the left lower outer quadrant noted on recent screening study dated 09/30/2011 from [place].  DIGITAL DIAGNOSTIC LEFT MAMMOGRAM  AND LEFT BREAST ULTRASOUND:  Comparison:  04/18/2010 and 03/29/2009 from [facility]  Findings:  Additional views confirm the presence of a small spiculated mass in the left lower outer quadrant posteriorly. Mammographically this measures approximately 9 x 6 x 9 mm.  On physical exam, no mass is palpated in the left lower outer quadrant  Ultrasound is performed, showing an irregularly marginated taller than wide solid mass at 4 o'clock 10 cm from  the left nipple measuring 5 mm in size.  This has a hyperechoic rim.  Sonography of the left axilla demonstrates no abnormal axillary nodes.  The appearance is suspicious for invasive mammary carcinoma. Biopsy is recommended.  Ultrasound-guided core needle biopsy was discussed with the patient and she agreed with this plan.  IMPRESSION: Highly suspicious spiculated mass in the left lower outer quadrant at 4 o'clock.  Ultrasound-guided core needle biopsy is recommended and has been scheduled for 10/14/2011.  Results were telephoned to [place] at Dr. Gwynne Edinger office.  BI-RADS CATEGORY 5:  Highly suggestive of malignancy - appropriate action should be taken.  Original Report Authenticated By: Chaney Born, M.D.   Mm Radiologist Eval And Mgmt  10/15/2011  *RADIOLOGY REPORT*  ESTABLISHED PATIENT OFFICE VISIT - LEVEL II [phone])  Chief Complaint:  The patient returns for discussion of the pathologic findings after ultrasound-guided core needle biopsy of a 5 mm mass in the left lower outer quadrant on 10/14/2011.  History:  Patient's screening mammogram performed on 09/30/2011 demonstrated a small mass in the left lower outer quadrant. Additional views confirmed the presence of an irregular 5 mm mass at 4 o'clock 10 cm from the left nipple. Ultrasound-guided core needle biopsy was performed on 10/14/2011.  Exam:  Examination of the biopsy site demonstrates moderate ecchymosis with no sign of hematoma or infection.  The patient reports no complications from the procedure.  Assessment and Plan:  Histologic evaluation demonstrates invasive ductal carcinoma, grade 1.  This is concordant with the imaging findings.  Breast MRI is scheduled for 10/21/2011.  The patient is scheduled to be seen in the [facility] on 10/23/2011.  Results were discussed with the patient and her daughter.  Questions were answered.  Educational materials were given.  Original Report Authenticated By: Chaney Born, M.D.      IMPRESSION/PLAN:This is a very pleasant 71 year old woman with stage I, clinical T1 CN  0 M0 left breast cancer.   She has been discussed at our multidisciplinary tumor board.  The consensus is that she be a good candidate for breast conservation. I talked to her about the option of a mastectomy and informed her that her expected overall survival would be equivalent between mastectomy and breast conservation, based upon randomized controlled data. She is enthusiastic about breast conservation. She would like to receive treatment in [place], rather than [place], which is due [place] of where she lives.  She understands that radiotherapy is an effective modality to minimize her risk of an in-breast recurrence after lumpectomy. We discussed the risks, benefits, and side effects of radiotherapy. We discussed that radiation would take approximately 5 weeks to complete and that I would give the patient a few weeks to heal following surgery before starting treatment planning.  Based on her body habitus, I am not sure she would be a candidate for the [place] 3.5 week regimen. She will not likely need a boost treatment due to her age and favorable tumor characteristics. We spoke about acute effects including skin irritation and fatigue as well as much less common late effects including lung and heart irritation. We spoke about the latest technology that is used to minimize the risk of late effects for breast cancer patients undergoing radiotherapy. No guarantees of treatment were given. The patient is enthusiastic about proceeding with treatment. I look forward to participating in the patient's care.  Therefore, following lumpectomy and sentinel lymph node biopsy she'll be sent to me for radiation planning. After radiotherapy, she will undergo antiestrogen therapy. I have given the patient my contact information if she has any further questions following today's consultation.     -----------------------------------  Eppie Gibson, MD

## 2011-10-23 NOTE — Progress Notes (Signed)
Patient ID: Laura Cruz, female   DOB: 29-Jan-1941, 71 y.o.   MRN: [id number]  No chief complaint on file.   HPI Laura Cruz is a 71 y.o. female.  Pt presents with abnormal left mammogram.  1 cm mass noted on mammogram and MRI. Core biopsy showed invasive mammary carcinoma  ER POS PR POS  HER 2 NEU NEGATIVE.  Pt sent at the request of Dr Stann Mainland.  Denies any history of mass,  Drainage or breast change. HPI  Past Medical History  Diagnosis Date  . Hypertension   . Diabetes mellitus   . Hypercholesteremia   . Arthritis   . Anxiety   . Depression   . Gastroesophageal reflux disease   . Hypothyroidism   . Hiatal hernia   . GERD (gastroesophageal reflux disease)   . Osteoarthritis   . Mass     Left ear, benign    Past Surgical History  Procedure Date  . Bilateral knee arthroscopy 2000, June 2007    Replacements  . Tonsillectomy   . Partial hysterectomy   . Hemorrhoid surgery   . Back surgery     2, pinched nerve  . US echocardiography 02-27-2009    Est EF 55-60%  . Cardiovascular stress test 02-24-2009    EF 68%    Family History  Problem Relation Age of Onset  . Diabetes Mother   . Hypertension Father   . Stroke Father   . Prostate cancer Father   . Coronary artery disease Father   . Hypertension Sister   . Hypertension Brother   . Hypertension Brother   . Lung cancer Sister     Social History History  Substance Use Topics  . Smoking status: Never Smoker   . Smokeless tobacco: Not on file  . Alcohol Use: No    Allergies  Allergen Reactions  . Morphine And Related Other (See Comments)    "Makes me crazy."  . Cephalexin Rash    Current Outpatient Prescriptions  Medication Sig Dispense Refill  . amLODipine (NORVASC) 2.5 MG tablet Take 2.5 mg by mouth daily.        Marland Kitchen aspirin 325 MG tablet Take 325 mg by mouth daily.        Marland Kitchen atenolol (TENORMIN) 50 MG tablet Take 50 mg by mouth daily.        Marland Kitchen atorvastatin (LIPITOR) 10 MG tablet Take 10 mg by mouth  daily.        Marland Kitchen buPROPion (WELLBUTRIN XL) 300 MG 24 hr tablet Take 300 mg by mouth daily.        . cyclobenzaprine (FLEXERIL) 10 MG tablet Take 10 mg by mouth 3 (three) times daily as needed.      Marland Kitchen estradiol (ESTRACE) 0.5 MG tablet Take 0.5 mg by mouth daily.       Marland Kitchen glucose blood test strip 1 each by Other route. Use as instructed       . HYDROcodone-acetaminophen (NORCO) 7.5-325 MG per tablet Take 1 tablet by mouth every 8 (eight) hours as needed.      Marland Kitchen HYDROcodone-homatropine (HYCODAN) 5-1.5 MG/5ML syrup Take 5 mLs by mouth every 6 (six) hours as needed.      . Lancets Misc. (UNISTIK 2 NORMAL) MISC by Does not apply route. Lancet Once a Day DX 250.00       . levothyroxine (SYNTHROID, LEVOTHROID) 125 MCG tablet Take 125 mcg by mouth daily.        Marland Kitchen lisinopril-hydrochlorothiazide (PRINZIDE,ZESTORETIC) 20-12.5 MG per  tablet Take 1 tablet by mouth daily.        . nitroGLYCERIN (NITROSTAT) 0.4 MG SL tablet Place 0.4 mg under the tongue every 5 (five) minutes as needed.        Marland Kitchen omeprazole (PRILOSEC) 20 MG capsule Take 20 mg by mouth daily.        . OXYCODONE HCL PO Take 10 mg by mouth every 6 (six) hours as needed.       . sertraline (ZOLOFT) 100 MG tablet Take 100 mg by mouth daily.        . simvastatin (ZOCOR) 10 MG tablet Take 10 mg by mouth at bedtime.      . traMADol (ULTRAM) 50 MG tablet Take 50 mg by mouth every 6 (six) hours as needed.        . traZODone (DESYREL) 100 MG tablet Take 100 mg by mouth at bedtime.         Review of Systems Review of Systems  Constitutional: Negative for fever, chills and unexpected weight change.  HENT: Negative for hearing loss, congestion, sore throat, trouble swallowing and voice change.   Eyes: Negative for visual disturbance.  Respiratory: Negative for cough and wheezing.   Cardiovascular: Negative for chest pain, palpitations and leg swelling.  Gastrointestinal: Negative for nausea, vomiting, abdominal pain, diarrhea, constipation, blood in stool,  abdominal distention and anal bleeding.  Genitourinary: Negative for hematuria, vaginal bleeding and difficulty urinating.  Musculoskeletal: Negative for arthralgias.  Skin: Negative for rash and wound.  Neurological: Negative for seizures, syncope and headaches.  Hematological: Negative for adenopathy. Does not bruise/bleed easily.  Psychiatric/Behavioral: Negative for confusion.    There were no vitals taken for this visit.  Physical Exam Physical Exam  Constitutional: She is oriented to person, place, and time. She appears well-developed and well-nourished.  HENT:  Head: Normocephalic and atraumatic.  Eyes: EOM are normal. Pupils are equal, round, and reactive to light.  Neck: Normal range of motion. Neck supple.  Cardiovascular: Normal rate and regular rhythm.   Pulmonary/Chest: Effort normal and breath sounds normal.       Bruising left breast lower inner quadrant.  No mass.  Axilla normal.  Right breast normal.  Right axilla normal.  Abdominal: Soft. Bowel sounds are normal.  Musculoskeletal: Normal range of motion.  Neurological: She is alert and oriented to person, place, and time.  Skin: Skin is warm and dry.  Psychiatric: She has a normal mood and affect. Her behavior is normal. Judgment and thought content normal.    Data Reviewed Left breast mass 1 cm central lower breast  ER pos PR POS HER 2 NEU NEGATIVE    Assessment    STAGE 1 LEFT BREAST CANCER    Plan    PT DESIRES BREAST CONSERVATION.  The procedure has been discussed with the patient. Alternatives to surgery have been discussed with the patient.  Risks of surgery include bleeding,  Infection,  Seroma formation, death,  and the need for further surgery.   The patient understands and wishes to proceed.Sentinel lymph node mapping and dissection has been discussed with the patient.  Risk of bleeding,  Infection,  Seroma formation,  Additional procedures,,  Shoulder weakness ,  Shoulder stiffness,  Nerve and blood  vessel injury and reaction to the mapping dyes have been discussed.  Alternatives to surgery have been discussed with the patient.  The patient agrees to proceed with left breast lumpectomy and SLN mapping.       Adalynne Steffensmeier A. 10/23/2011,  2:46 PM

## 2011-10-23 NOTE — Progress Notes (Signed)
Laura Cruz [id number] 1941-05-19 71 y.o. 10/23/2011 2:40 PM  CC  [name], MD, MD [address] [place] [address] Dr. Eppie Gibson Dr. [name]  REASON FOR CONSULTATION:  71 year old female with 11 mm stage I invasive ductal carcinoma being seen in the multidisciplinary breast clinic for discussion of treatment options.  STAGE:   Stage 1 (T1No)  REFERRING PHYSICIAN: Dr. Marcello Moores Cornett  HISTORY OF PRESENT ILLNESS:  Laura Cruz is a 71 y.o. female.  With multiple medical problems including hypercholesterolemia chronic pain and arthritis as well as diabetes. Patient recently had a screening mammogram performed that showed a 9 mm left breast mass. She went on to have an ultrasound that only showed a 5 mm mass at the 4:00 position. She had a by core needle biopsy performed that showed an invasive ductal carcinoma grade 1 ER +100% PR +100% proliferation marker marker Ki-67 3% and HER-2/neu negative. Patient went on to have MRI of the breasts performed that showed a solitary 11 mm area. Radiographic staging T1 C. NX MX. Patient hasn't now seen in the multidisciplinary breast clinic for discussion of treatment options. She is without any acute complaints.   Past Medical History: Past Medical History  Diagnosis Date  . Hypertension   . Diabetes mellitus   . Hypercholesteremia   . Arthritis   . Anxiety   . Depression   . Gastroesophageal reflux disease   . Hypothyroidism   . Hiatal hernia   . GERD (gastroesophageal reflux disease)   . Osteoarthritis   . Mass     Left ear, benign    Past Surgical History: Past Surgical History  Procedure Date  . Bilateral knee arthroscopy 2000, June 2007    Replacements  . Tonsillectomy   . Partial hysterectomy   . Hemorrhoid surgery   . Back surgery     2, pinched nerve  . US echocardiography 02-27-2009    Est EF 55-60%  . Cardiovascular stress test 02-24-2009    EF 68%    Family History: Family  History  Problem Relation Age of Onset  . Diabetes Mother   . Hypertension Father   . Stroke Father   . Prostate cancer Father   . Coronary artery disease Father   . Hypertension Sister   . Hypertension Brother   . Hypertension Brother   . Lung cancer Sister     Social History History  Substance Use Topics  . Smoking status: Never Smoker   . Smokeless tobacco: Not on file  . Alcohol Use: No    Allergies: Allergies  Allergen Reactions  . Morphine And Related Other (See Comments)    "Makes me crazy."  . Cephalexin Rash    Current Medications: Current Outpatient Prescriptions  Medication Sig Dispense Refill  . amLODipine (NORVASC) 2.5 MG tablet Take 2.5 mg by mouth daily.        Marland Kitchen aspirin 325 MG tablet Take 325 mg by mouth daily.        Marland Kitchen atenolol (TENORMIN) 50 MG tablet Take 50 mg by mouth daily.        Marland Kitchen atorvastatin (LIPITOR) 10 MG tablet Take 10 mg by mouth daily.        Marland Kitchen buPROPion (WELLBUTRIN XL) 300 MG 24 hr tablet Take 300 mg by mouth daily.        . cyclobenzaprine (FLEXERIL) 10 MG tablet Take 10 mg by mouth 3 (three) times daily as needed.      Marland Kitchen glucose  blood test strip 1 each by Other route. Use as instructed       . HYDROcodone-acetaminophen (NORCO) 7.5-325 MG per tablet Take 1 tablet by mouth every 8 (eight) hours as needed.      Marland Kitchen HYDROcodone-homatropine (HYCODAN) 5-1.5 MG/5ML syrup Take 5 mLs by mouth every 6 (six) hours as needed.      . Lancets Misc. (UNISTIK 2 NORMAL) MISC by Does not apply route. Lancet Once a Day DX 250.00       . levothyroxine (SYNTHROID, LEVOTHROID) 125 MCG tablet Take 125 mcg by mouth daily.        Marland Kitchen lisinopril-hydrochlorothiazide (PRINZIDE,ZESTORETIC) 20-12.5 MG per tablet Take 1 tablet by mouth daily.        . nitroGLYCERIN (NITROSTAT) 0.4 MG SL tablet Place 0.4 mg under the tongue every 5 (five) minutes as needed.        Marland Kitchen omeprazole (PRILOSEC) 20 MG capsule Take 20 mg by mouth daily.        . OXYCODONE HCL PO Take 10 mg by mouth  every 6 (six) hours as needed.       . sertraline (ZOLOFT) 100 MG tablet Take 100 mg by mouth daily.        . simvastatin (ZOCOR) 10 MG tablet Take 10 mg by mouth at bedtime.      . traMADol (ULTRAM) 50 MG tablet Take 50 mg by mouth every 6 (six) hours as needed.        . traZODone (DESYREL) 100 MG tablet Take 100 mg by mouth at bedtime.       Marland Kitchen estradiol (ESTRACE) 0.5 MG tablet Take 0.5 mg by mouth daily.         OB/GYN History:Menarche at age 60 patient is postmenopausal she was on hormone replacement therapy for about 15 years she discontinued it on 10/16/2011. Patient is G2 P2 her first live birth was at 24 she has completed all of her family.  Fertility Discussion: Patient has completed her family Prior History of Cancer:Patient did have some skin cancer removed from her nose by Dr. Nevada Crane in 2011.  Health Maintenance: The patient had a colonoscopy about 3 years ago in 2010. She had a bone density performed about 2 years ago 2011. Her last Pap smear was in April 2013. Her first mammogram began at the age of 19 and yearly thereafter.  ECOG PERFORMANCE STATUS: 0 - Asymptomatic  Genetic Counseling/testing: Genetic counseling and what and testing was discussed with the patient After obtaining a complete family history. A referral to the genetic counselor has been made. REVIEW OF SYSTEMS:  Ears, nose, mouth, throat, and face: negative Respiratory: negative Cardiovascular: negative Gastrointestinal: negative Genitourinary:negative Integument/breast: negative Hematologic/lymphatic: negative Musculoskeletal:positive for arthralgias, back pain, myalgias and stiff joints Neurological: negative Behavioral/Psych: positive for anxiety  PHYSICAL EXAMINATION: Blood pressure 151/62, pulse 71, temperature 97.9 F (36.6 C), temperature source Oral, height 5' 2.5" (1.588 m), weight 156 lb 8 oz (70.988 kg).  [id number], no distress, well nourished, well developed and anxious SKIN: skin color,  texture, turgor are normal HEAD: Normocephalic EYES: PERRLA, EOMI, sclera clear EARS: External ears normal OROPHARYNX:no exudate, no erythema and lips, buccal mucosa, and tongue normal  NECK: supple, no adenopathy LYMPH:  no palpable lymphadenopathy, no hepatosplenomegaly BREAST:Bilateral breast examination was performed the left breast revealed area of ecchymosis from her recent biopsy and possibly a slight fullness do to hematoma. No nipple discharge no other masses. Right breast no masses or nipple discharge. LUNGS: clear to auscultation and  percussion HEART: regular rate & rhythm, no murmurs and no gallops ABDOMEN:abdomen soft, non-tender, normal bowel sounds and no masses or organomegaly BACK: Back symmetric, no curvature., No CVA tenderness EXTREMITIES:no edema, no clubbing, no cyanosis  NEURO: alert & oriented x 3 with fluent speech, no focal motor/sensory deficits, gait normal, reflexes normal and symmetric   STUDIES/RESULTS: US Breast Left  Oct 21, 2011  *RADIOLOGY REPORT*  Clinical Data:  The patient returns for evaluation of a possible mass in the left lower outer quadrant noted on recent screening study dated 09/30/2011 from [facility].  DIGITAL DIAGNOSTIC LEFT MAMMOGRAM  AND LEFT BREAST ULTRASOUND:  Comparison:  04/18/2010 and 03/29/2009 from [facility]  Findings:  Additional views confirm the presence of a small spiculated mass in the left lower outer quadrant posteriorly. Mammographically this measures approximately 9 x 6 x 9 mm.  On physical exam, no mass is palpated in the left lower outer quadrant  Ultrasound is performed, showing an irregularly marginated taller than wide solid mass at 4 o'clock 10 cm from the left nipple measuring 5 mm in size.  This has a hyperechoic rim.  Sonography of the left axilla demonstrates no abnormal axillary nodes.  The appearance is suspicious for invasive mammary carcinoma. Biopsy is recommended.  Ultrasound-guided core needle  biopsy was discussed with the patient and she agreed with this plan.  IMPRESSION: Highly suspicious spiculated mass in the left lower outer quadrant at 4 o'clock.  Ultrasound-guided core needle biopsy is recommended and has been scheduled for 10/14/2011.  Results were telephoned to [place] at Dr. Gwynne Edinger office.  BI-RADS CATEGORY 5:  Highly suggestive of malignancy - appropriate action should be taken.  Original Report Authenticated By: Chaney Born, M.D.   Mr Breast Bilateral W Wo Contrast  10/21/2011  *RADIOLOGY REPORT*  Clinical Data: Recently diagnosed invasive ductal carcinoma, grade 1 in the 4 o'clock region of the left breast.  BILATERAL BREAST MRI WITH AND WITHOUT CONTRAST  Technique: Multiplanar, multisequence MR images of both breasts were obtained prior to and following the intravenous administration of 52ml of multihance.  Three dimensional images were evaluated at the independent DynaCad workstation.  Comparison:  Mammograms dated 09/30/2011,  10-21-2011, 10/14/2011 and 04/18/2010  Findings: There is a moderate background parenchymal enhancement pattern.  The patient has a 2.6 cm post biopsy hematoma in the lower outer quadrant of the left breast with a thin rim of surrounding enhancement. This corresponds well with the post biopsy mammogram. Just lateral to the hematoma is an enhancing nodule measuring 10 x 7 x 11 mm associated with a clip artifact.  This corresponds with the known malignancy.  No other areas of abnormal enhancement are seen in the left breast.  There is no abnormal enhancement the right breast.  There is no enlarged axillary or internal mammary adenopathy.  IMPRESSION: 11 mm enhancing mass in the lower outer quadrant of the left breast corresponding well with the known malignancy.  Post biopsy hematoma.  Treatment planning is recommended.  THREE-DIMENSIONAL MR IMAGE RENDERING ON INDEPENDENT WORKSTATION:  Three-dimensional MR images were rendered by post-processing of the original  MR data on an independent workstation.  The three- dimensional MR images were interpreted, and findings were reported in the accompanying complete MRI report for this study.  BI-RADS CATEGORY 6:  Known biopsy-proven malignancy - appropriate action should be taken.   .  Original Report Authenticated By: Jeanie Cooks. Miquel Dunn, M.D.   [place] Core Biopsy  10/14/2011  *RADIOLOGY REPORT*  Clinical Data:  Suspicious left breast mass  ULTRASOUND GUIDED VACUUM ASSISTED CORE BIOPSY OF THE LEFT BREAST  Comparison: With priors  I met with the patient and we discussed the procedure of ultrasound- guided biopsy, including benefits and alternatives.  We discussed the high likelihood of a successful procedure. We discussed the risks of the procedure, including infection, bleeding, tissue injury, clip migration, and inadequate sampling.  Informed, written consent was given.  Using sterile technique, 2% lidocaine, ultrasound guidance, and a 12 gauge vacuum assisted needle, biopsy was performed of the mass in the 4 o'clock region of the left breast.  At the conclusion of the procedure, a tissue marker clip was deployed into the biopsy cavity.  Follow-up 2-view mammogram was performed and dictated separately.  IMPRESSION: Ultrasound-guided biopsy of the left breast.  No apparent complications.  Original Report Authenticated By: Jeanie Cooks. Miquel Dunn, M.D.   Mm Digital Diagnostic Unilat L  10/14/2011  *RADIOLOGY REPORT*  Clinical Data:  Suspicious left breast mass  DIGITAL DIAGNOSTIC LEFT MAMMOGRAM  Comparison:  With priors  Findings:  Films are performed following ultrasound guided biopsy of a mass in the 4 o'clock region of the left breast.  Mammographic images demonstrate there is a ribbon shaped InRad clip in the lateral aspect of the left breast.  IMPRESSION: Status post ultrasound-guided core biopsy of the left breast mass with pathology pending.  Original Report Authenticated By: Jeanie Cooks. Miquel Dunn, M.D.   Mm Digital Diagnostic Unilat  L  10/08/2011  *RADIOLOGY REPORT*  Clinical Data:  The patient returns for evaluation of a possible mass in the left lower outer quadrant noted on recent screening study dated 09/30/2011 from [place].  DIGITAL DIAGNOSTIC LEFT MAMMOGRAM  AND LEFT BREAST ULTRASOUND:  Comparison:  04/18/2010 and 03/29/2009 from [facility]  Findings:  Additional views confirm the presence of a small spiculated mass in the left lower outer quadrant posteriorly. Mammographically this measures approximately 9 x 6 x 9 mm.  On physical exam, no mass is palpated in the left lower outer quadrant  Ultrasound is performed, showing an irregularly marginated taller than wide solid mass at 4 o'clock 10 cm from the left nipple measuring 5 mm in size.  This has a hyperechoic rim.  Sonography of the left axilla demonstrates no abnormal axillary nodes.  The appearance is suspicious for invasive mammary carcinoma. Biopsy is recommended.  Ultrasound-guided core needle biopsy was discussed with the patient and she agreed with this plan.  IMPRESSION: Highly suspicious spiculated mass in the left lower outer quadrant at 4 o'clock.  Ultrasound-guided core needle biopsy is recommended and has been scheduled for 10/14/2011.  Results were telephoned to [place] at Dr. Gwynne Edinger office.  BI-RADS CATEGORY 5:  Highly suggestive of malignancy - appropriate action should be taken.  Original Report Authenticated By: Chaney Born, M.D.   Mm Radiologist Eval And Mgmt  10/15/2011  *RADIOLOGY REPORT*  ESTABLISHED PATIENT OFFICE VISIT - LEVEL II [phone])  Chief Complaint:  The patient returns for discussion of the pathologic findings after ultrasound-guided core needle biopsy of a 5 mm mass in the left lower outer quadrant on 10/14/2011.  History:  Patient's screening mammogram performed on 09/30/2011 demonstrated a small mass in the left lower outer quadrant. Additional views confirmed the presence of an irregular 5 mm mass at 4 o'clock 10 cm from  the left nipple. Ultrasound-guided core needle biopsy was performed on 10/14/2011.  Exam:  Examination of the biopsy site demonstrates moderate ecchymosis with no sign of hematoma  or infection.  The patient reports no complications from the procedure.  Assessment and Plan:  Histologic evaluation demonstrates invasive ductal carcinoma, grade 1.  This is concordant with the imaging findings.  Breast MRI is scheduled for 10/21/2011.  The patient is scheduled to be seen in the [facility] on 10/23/2011.  Results were discussed with the patient and her daughter.  Questions were answered.  Educational materials were given.  Original Report Authenticated By: Chaney Born, M.D.     LABS:    Chemistry      Component Value Date/Time   NA 141 10/23/2011 1302   K 3.3* 10/23/2011 1302   CL 100 10/23/2011 1302   CO2 26 10/23/2011 1302   BUN 20 10/23/2011 1302   CREATININE 1.39* 10/23/2011 1302      Component Value Date/Time   CALCIUM 9.5 10/23/2011 1302   ALKPHOS 53 10/23/2011 1302   AST 23 10/23/2011 1302   ALT 19 10/23/2011 1302   BILITOT 0.3 10/23/2011 1302      Lab Results  Component Value Date   WBC 5.8 10/23/2011   HGB 12.6 10/23/2011   HCT 36.4 10/23/2011   MCV 81.4 10/23/2011   PLT 127* 10/23/2011       PATHOLOGY: ADDITIONAL INFORMATION: PROGNOSTIC INDICATORS - ACIS Results IMMUNOHISTOCHEMICAL AND MORPHOMETRIC ANALYSIS BY THE AUTOMATED CELLULAR IMAGING SYSTEM (ACIS) Estrogen Receptor (Negative, <1%): 100%,POSITIVE, STRONG STAINING INTENSITY Progesterone Receptor (Negative, <1%): 100%,POSITIVE, STRONG STAINING INTENSITY Proliferation Marker Ki67 by M IB-1 (Low<20%): 3% All controls stained appropriately Aldona Bar MD Pathologist, Electronic Signature ( Signed 10/18/2011) CHROMOGENIC IN-SITU HYBRIDIZATION Interpretation HER-2/NEU BY CISH - NO AMPLIFICATION OF HER-2 DETECTED. THE RATIO OF HER-2: CEP 17 SIGNALS WAS 1.59. Reference range: Ratio: HER2:CEP17 <  1.8 - gene amplification not observed Ratio: HER2:CEP 17 1.8-2.2 - equivocal result Ratio: HER2:CEP17 > 2.2 - gene amplification observed Aldona Bar MD Pathologist, Electronic Signature ( Signed 10/17/2011) 1 of 2 FINAL for Laura Cruz, Laura Cruz ([id number]) FINAL DIAGNOSIS Diagnosis Breast, left, needle core biopsy, 4 o'clock - INVASIVE DUCTAL CARCINOMA, GRADE I, SEE COMMENT. Microscopic Comment Although the grade of tumor is best assessed at resection, with these biopsy, the invasive tumor is grade I. Breast prognostic studies are pending and will be reported in an addendum. The case is reviewed with Dr. Sallee Provencal who concurs. (CR:mw 10-15-11) Mali RUND DO Pathologist, Electronic Signature  ASSESSMENT    71 year old female with new diagnosis of stage I invasive ductal carcinoma of the left breast. Patient presented with a abnormal screening mammogram that showed a 9 mm mass subsequent bleeding ultrasound confirmed this and an MRI showed a solitary 11 mm area with it radiate graphic staging of T1 C. NX MX. Patient underwent a core needle biopsy that showed invasive ductal carcinoma grade 1 ER +100% PR +100% HER-2/neu negative with a proliferation marker Ki-67 at 3%. Patient is now seen in the multidisciplinary breast clinic for discussion of treatment options. Her case was discussed at the multidisciplinary breast clinic. She was seen by radiation oncology as well as surgery. I saw the patient and discussed adjuvant treatments. For this individual with a favorable disease I do think that she would do quite well with just adjuvant antiestrogen therapy with an aromatase inhibitor. We discussed the risks and benefits. I discussed her for pathology in detail and the rationale for her management. She demonstrated understanding.  PLAN:    #1 patient will proceed with lumpectomy with sentinel node biopsy. She will be set  up to have this done by Dr. Brantley Stage. His office will be calling her with a  schedule.  #2 once patient has her surgery I will plan on seeing her back and we will discuss her final pathology. If it remains favorable and she has no other findings that she would certainly be a candidate for postlumpectomy radiation therapy. This will be done prior to her beginning any kind of systemic treatment.  #3 the patient and I and her family discussed adjuvant antiestrogen therapy. My recommendation would be an aromatase inhibitor. This certainly could consist of either Arimidex or Aromasin or letrozole. Risks and benefits of these drugs were discussed with him briefly.These recommendations are based on NCCN guidelines for stage I ER positive breast cancer  #4 patient will be seen back to see me in about 4-5 weeks time.  #5 based on patient's family history I did recommend genetic counseling and possibly testing. And a referral has been made       Thank you so much for allowing me to participate in the care of [place]. I will continue to follow up the patient with you and assist in her care.  All questions were answered. The patient knows to call the clinic with any problems, questions or concerns. We can certainly see the patient much sooner if necessary.  I spent 60 minutes counseling the patient face to face. The total time spent in the appointment was 60 minutes.  Marcy Panning, MD Medical/Oncology [facility] [phone] (beeper) [phone] (Office)  10/23/2011, 2:40 PM 10/23/2011, 2:40 PM

## 2011-10-24 ENCOUNTER — Encounter: Payer: Self-pay | Admitting: *Deleted

## 2011-10-24 NOTE — Progress Notes (Signed)
Mailed after appt letter to pt. 

## 2011-10-31 ENCOUNTER — Telehealth: Payer: Self-pay | Admitting: *Deleted

## 2011-10-31 NOTE — Telephone Encounter (Signed)
Left vm for pt to return call concerning [place] from 10/23/11.

## 2011-10-31 NOTE — Telephone Encounter (Signed)
Spoke to pt concerning [place] from 5/8.  Pt denies questions or concerns regarding dx or treatment care plan.  Confirmed appts for surgery and f/u with Dr. Humphrey Rolls.  Informed pt she will be receiving a call to be scheduled for f/u with Dr. Isidore Moos.  Encourage pt to call with needs.  Received verbal understanding.  Contact information given.

## 2011-11-01 ENCOUNTER — Encounter: Payer: Self-pay | Admitting: *Deleted

## 2011-11-01 ENCOUNTER — Telehealth: Payer: Self-pay | Admitting: *Deleted

## 2011-11-01 NOTE — Telephone Encounter (Signed)
left voice message informing the patient of the new date and time on 12-02-2011 starting at 12:00om with labs

## 2011-11-04 ENCOUNTER — Ambulatory Visit (HOSPITAL_BASED_OUTPATIENT_CLINIC_OR_DEPARTMENT_OTHER): Payer: Medicare Other | Admitting: Genetic Counselor

## 2011-11-04 ENCOUNTER — Other Ambulatory Visit: Payer: Medicare Other | Admitting: Lab

## 2011-11-04 DIAGNOSIS — C50519 Malignant neoplasm of lower-outer quadrant of unspecified female breast: Secondary | ICD-10-CM

## 2011-11-04 DIAGNOSIS — Z801 Family history of malignant neoplasm of trachea, bronchus and lung: Secondary | ICD-10-CM

## 2011-11-04 DIAGNOSIS — Z8042 Family history of malignant neoplasm of prostate: Secondary | ICD-10-CM

## 2011-11-04 DIAGNOSIS — C50912 Malignant neoplasm of unspecified site of left female breast: Secondary | ICD-10-CM

## 2011-11-04 NOTE — Progress Notes (Signed)
Dr.  Humphrey Rolls requested a consultation for genetic counseling and risk assessment for Laura Cruz, a 71 y.o. female, for discussion of her personal history of breast cancer. She presents to clinic today to discuss the possibility of a genetic predisposition to cancer, and to further clarify her risks, as well as her family members' risks for cancer. The patient's personal and family history do not meet national guidelines for genetic testing for BRCA mutations.  HISTORY OF PRESENT ILLNESS: In 2013, at the age of 10, Laura Cruz was diagnosed with invasive ductal carcinoma of the right breast. This will be treated with surgery on Nov 13, 2011.    Past Medical History  Diagnosis Date  . Hypertension   . Diabetes mellitus   . Hypercholesteremia   . Arthritis   . Anxiety   . Depression   . Gastroesophageal reflux disease   . Hypothyroidism   . Hiatal hernia   . GERD (gastroesophageal reflux disease)   . Osteoarthritis   . Mass     Left ear, benign    Past Surgical History  Procedure Date  . Bilateral knee arthroscopy 2000, June 2007    Replacements  . Tonsillectomy   . Partial hysterectomy   . Hemorrhoid surgery   . Back surgery     2, pinched nerve  . US echocardiography 02-27-2009    Est EF 55-60%  . Cardiovascular stress test 02-24-2009    EF 68%    History  Substance Use Topics  . Smoking status: Never Smoker   . Smokeless tobacco: Not on file  . Alcohol Use: No    REPRODUCTIVE HISTORY AND PERSONAL RISK ASSESSMENT FACTORS: Menarche was at age 63.   Menopause Uterus Intact: No Ovaries Intact: Yes G3P2A1 , first live birth at age 61  She has not previously undergone treatment for infertility.   OCP use for 12 years   She has used HRT in the past.    FAMILY HISTORY:  We obtained a detailed, 4-generation family history.  Significant diagnoses are listed below: Family History  Problem Relation Age of Onset  . Diabetes Mother   . Hypertension Father   .  Stroke Father   . Prostate cancer Father   . Coronary artery disease Father   . Hypertension Sister   . Hypertension Brother   . Hypertension Brother   . Lung cancer Sister   The patient was diagnosed with breast cancer at age 78.  She has 3 brothers and 2 sisters - one brother died of sinus cavity cancer in his 50s and her sister died of lung cancer in her late 42s.  Both were smokers.  The patient's father was diagnosed with prostate cancer in his 55s and died at 38.  There is no other reported cancer history on this side of the family.  The patient's mother was diagnosed with colon cancer at age 75.  She had a bag for the rest of her life as a result of this cancer.  She had five brothers - three had a brain tumor in their 52s that they each died from.  One of these brothers had a daughter with breast cancer in her 66s.  There is no other reported cancer history on this side of the family.  Patient's maternal ancestors are of unknown descent, and paternal ancestors are of [place] descent. There is no reported Ashkenazi Jewish ancestry. There is no  known consanguinity.  GENETIC COUNSELING RISK ASSESSMENT, DISCUSSION, AND SUGGESTED FOLLOW UP:  We reviewed the natural history and genetic etiology of sporadic, familial and hereditary cancer syndromes.  About 5-10% of breast cancer is hereditary and of this, about 85% is the result of mutations in the BRCA1 or BRCA2 genes.  We reviewed the red flags of hereditary cancer syndromes and the dominant inheritance patterns.  The patient's family history does not meet national guidelines for genetic testing for BRCA mutations.  We briefly discussed that finding out more about the brain cancer in their family.  We discussed that the likelihood that they have a different cancer syndrome running in the family is low.  The patient's personal and family history is suggestive of the following possible diagnosis: familial cancer  We discussed that identification of  a hereditary cancer syndrome may help her care providers tailor the patients medical management. If a mutation indicating hereditary cancer is detected in this case, the [employer]  recommendations would include increased screening and possibly prophylactic surgery. If a mutation is detected, the patient will be referred back to the referring provider and to any additional appropriate care providers to discuss the relevant options.   If a mutation is not found in the patient, this will decrease the likelihood of hereditary cancer as the explanation for her breast cancer. Cancer surveillance options would be discussed for the patient according to the appropriate standard [place] guidelines, with consideration of their personal and family history risk factors. In this case, the patient will be referred back to their care providers for discussions of management.   In order to estimate her chance of having a BRCA1 or BRCA2 mutation, we used statistical models (Penn II) and laboratory data that take into account her personal medical history, family history and ancestry.  Because each model is different, there can be a lot of variability in the risks they give.  Therefore, these numbers must be considered a rough range and not a precise risk of having a BRCA1 or BRCA2 mutation.  These models estimate that she has approximately a 3-4% chance of having a mutation. Based on this assessment of her family and personal history, genetic testing is not recommended.  After considering the risks, benefits, and limitations, the patient declined testing as she did not meet national or Medicare guidelines for genetic testing.   Per the patient's request, we will contact her by telephone to discuss these results. A follow up genetic counseling visit will be scheduled if indicated.  The patient was seen for a total of 60 minutes, greater than  50% of which was spent face-to-face counseling.  This plan is being carried out per Dr. Laurelyn Sickle recommendations.  This note will also be sent to the referring provider via the electronic medical record. The patient will be supplied with a summary of this genetic counseling discussion as well as educational information on the discussed hereditary cancer syndromes following the conclusion of their visit.   Patient was discussed with Dr. Marcy Panning.  _______________________________________________________________________ For Office Staff:  Number of people involved in session: 4 Was an Intern/ student involved with case: yes

## 2011-11-05 ENCOUNTER — Encounter: Payer: Self-pay | Admitting: Genetic Counselor

## 2011-11-05 ENCOUNTER — Telehealth: Payer: Self-pay | Admitting: *Deleted

## 2011-11-05 NOTE — Telephone Encounter (Signed)
Spoke to pt daughter Lelon Frohlich and gave pt future appts for surgery and f/u appts with Dr. Isidore Moos and Dr. Humphrey Rolls.  No further needs or concerns voiced.  Contact information given.

## 2011-11-08 ENCOUNTER — Ambulatory Visit
Admission: RE | Admit: 2011-11-08 | Discharge: 2011-11-08 | Disposition: A | Discharge status: Home / Self Care | Payer: Medicare Other | Source: Ambulatory Visit | Attending: Surgery | Admitting: Surgery

## 2011-11-08 ENCOUNTER — Encounter (HOSPITAL_BASED_OUTPATIENT_CLINIC_OR_DEPARTMENT_OTHER)
Admission: RE | Admit: 2011-11-08 | Discharge: 2011-11-08 | Disposition: A | Discharge status: Home / Self Care | Payer: Medicare Other | Source: Ambulatory Visit | Attending: Surgery | Admitting: Surgery

## 2011-11-08 ENCOUNTER — Encounter (HOSPITAL_BASED_OUTPATIENT_CLINIC_OR_DEPARTMENT_OTHER): Payer: Self-pay | Admitting: *Deleted

## 2011-11-08 LAB — COMPREHENSIVE METABOLIC PANEL
ALT: 23 U/L (ref 0–35)
AST: 28 U/L (ref 0–37)
Albumin: 3.9 g/dL (ref 3.5–5.2)
Creatinine, Ser: 0.98 mg/dL (ref 0.50–1.10)
Total Bilirubin: 0.3 mg/dL (ref 0.3–1.2)

## 2011-11-08 LAB — CBC
MCH: 27.9 pg (ref 26.0–34.0)
MCHC: 33.9 g/dL (ref 30.0–36.0)
MCV: 82.4 fL (ref 78.0–100.0)
Platelets: 124 10*3/uL — ABNORMAL LOW (ref 150–400)
RBC: 4.55 MIL/uL (ref 3.87–5.11)

## 2011-11-12 ENCOUNTER — Other Ambulatory Visit (INDEPENDENT_AMBULATORY_CARE_PROVIDER_SITE_OTHER): Payer: Self-pay | Admitting: Surgery

## 2011-11-12 DIAGNOSIS — C50919 Malignant neoplasm of unspecified site of unspecified female breast: Secondary | ICD-10-CM

## 2011-11-12 NOTE — Progress Notes (Signed)
Platelet count of 124 on 11/08/11.  Platelet count of 127 on 10/23/11.  Left message with Pamala Hurry at Dr. Josetta Huddle office.

## 2011-11-12 NOTE — Progress Notes (Signed)
Lab values reviewed with Dr. Glennon Mac, [place] for surgery from anesthesia perspective.

## 2011-11-13 ENCOUNTER — Ambulatory Visit (HOSPITAL_BASED_OUTPATIENT_CLINIC_OR_DEPARTMENT_OTHER)
Admission: RE | Admit: 2011-11-13 | Discharge: 2011-11-13 | Disposition: A | Discharge status: Home / Self Care | Payer: Medicare Other | Source: Ambulatory Visit | Attending: Surgery | Admitting: Surgery

## 2011-11-13 ENCOUNTER — Encounter (HOSPITAL_BASED_OUTPATIENT_CLINIC_OR_DEPARTMENT_OTHER): Payer: Self-pay | Admitting: Certified Registered"

## 2011-11-13 ENCOUNTER — Encounter (HOSPITAL_BASED_OUTPATIENT_CLINIC_OR_DEPARTMENT_OTHER): Payer: Self-pay | Admitting: Surgery

## 2011-11-13 ENCOUNTER — Ambulatory Visit (HOSPITAL_BASED_OUTPATIENT_CLINIC_OR_DEPARTMENT_OTHER): Payer: Medicare Other | Admitting: Certified Registered"

## 2011-11-13 ENCOUNTER — Ambulatory Visit
Admission: RE | Admit: 2011-11-13 | Discharge: 2011-11-13 | Disposition: A | Discharge status: Home / Self Care | Payer: Medicare Other | Source: Ambulatory Visit | Attending: Surgery | Admitting: Surgery

## 2011-11-13 ENCOUNTER — Ambulatory Visit (HOSPITAL_COMMUNITY)
Admission: RE | Admit: 2011-11-13 | Discharge: 2011-11-13 | Disposition: A | Discharge status: Home / Self Care | Payer: Medicare Other | Source: Ambulatory Visit | Attending: Surgery | Admitting: Surgery

## 2011-11-13 ENCOUNTER — Encounter (HOSPITAL_BASED_OUTPATIENT_CLINIC_OR_DEPARTMENT_OTHER)
Admission: RE | Disposition: A | Discharge status: Home / Self Care | Payer: Self-pay | Source: Ambulatory Visit | Attending: Surgery

## 2011-11-13 ENCOUNTER — Encounter (HOSPITAL_BASED_OUTPATIENT_CLINIC_OR_DEPARTMENT_OTHER): Payer: Self-pay | Admitting: *Deleted

## 2011-11-13 DIAGNOSIS — C50519 Malignant neoplasm of lower-outer quadrant of unspecified female breast: Secondary | ICD-10-CM | POA: Insufficient documentation

## 2011-11-13 DIAGNOSIS — Z01812 Encounter for preprocedural laboratory examination: Secondary | ICD-10-CM | POA: Insufficient documentation

## 2011-11-13 DIAGNOSIS — C50919 Malignant neoplasm of unspecified site of unspecified female breast: Secondary | ICD-10-CM

## 2011-11-13 HISTORY — DX: Angina pectoris, unspecified: I20.9

## 2011-11-13 HISTORY — DX: Other specified postprocedural states: Z98.890

## 2011-11-13 HISTORY — DX: Personal history of other diseases of the digestive system: Z87.19

## 2011-11-13 HISTORY — DX: Other specified postprocedural states: R11.2

## 2011-11-13 HISTORY — PX: BREAST LUMPECTOMY: SHX2

## 2011-11-13 LAB — CBC
HCT: 37.2 % (ref 36.0–46.0)
MCHC: 34.1 g/dL (ref 30.0–36.0)
MCV: 83 fL (ref 78.0–100.0)
Platelets: UNDETERMINED 10*3/uL (ref 150–400)
RDW: 14.4 % (ref 11.5–15.5)

## 2011-11-13 LAB — POCT I-STAT, CHEM 8
Calcium, Ion: 1.26 mmol/L (ref 1.12–1.32)
HCT: 37 % (ref 36.0–46.0)
TCO2: 28 mmol/L (ref 0–100)

## 2011-11-13 LAB — DIFFERENTIAL
Basophils Absolute: 0 10*3/uL (ref 0.0–0.1)
Basophils Relative: 0 % (ref 0–1)
Eosinophils Relative: 4 % (ref 0–5)
Monocytes Absolute: 0.5 10*3/uL (ref 0.1–1.0)
Neutro Abs: 2.8 10*3/uL (ref 1.7–7.7)

## 2011-11-13 SURGERY — BREAST LUMPECTOMY WITH NEEDLE LOCALIZATION AND AXILLARY SENTINEL LYMPH NODE BX
Anesthesia: General | Site: Breast | Laterality: Left | Wound class: Clean

## 2011-11-13 MED ORDER — HYDROCODONE-ACETAMINOPHEN 5-325 MG PO TABS
1.0000 | ORAL_TABLET | Freq: Once | ORAL | Status: AC
  Administered 2011-11-13: 1 via ORAL

## 2011-11-13 MED ORDER — HYDROMORPHONE HCL PF 1 MG/ML IJ SOLN
0.2500 mg | INTRAMUSCULAR | Status: DC | PRN
  Administered 2011-11-13: 0.5 mg via INTRAVENOUS
  Administered 2011-11-13 (×2): 0.25 mg via INTRAVENOUS

## 2011-11-13 MED ORDER — FENTANYL CITRATE 0.05 MG/ML IJ SOLN
INTRAMUSCULAR | Status: DC | PRN
  Administered 2011-11-13: 50 ug via INTRAVENOUS
  Administered 2011-11-13 (×2): 25 ug via INTRAVENOUS

## 2011-11-13 MED ORDER — MIDAZOLAM HCL 2 MG/2ML IJ SOLN: 1.0000 mg | INTRAMUSCULAR | Status: DC | PRN

## 2011-11-13 MED ORDER — SCOPOLAMINE 1 MG/3DAYS TD PT72
1.0000 | MEDICATED_PATCH | Freq: Once | TRANSDERMAL | Status: DC
  Administered 2011-11-13: 1.5 mg via TRANSDERMAL

## 2011-11-13 MED ORDER — MIDAZOLAM HCL 2 MG/2ML IJ SOLN
1.0000 mg | INTRAMUSCULAR | Status: DC | PRN
  Administered 2011-11-13: 1 mg via INTRAVENOUS

## 2011-11-13 MED ORDER — HYDROCODONE-ACETAMINOPHEN 5-325 MG PO TABS: 1.0000 | ORAL_TABLET | Freq: Four times a day (QID) | ORAL | Status: AC | PRN

## 2011-11-13 MED ORDER — ONDANSETRON HCL 4 MG/2ML IJ SOLN
INTRAMUSCULAR | Status: DC | PRN
  Administered 2011-11-13: 4 mg via INTRAVENOUS

## 2011-11-13 MED ORDER — BUPIVACAINE-EPINEPHRINE 0.25% -1:200000 IJ SOLN
INTRAMUSCULAR | Status: DC | PRN
  Administered 2011-11-13: 20 mL

## 2011-11-13 MED ORDER — TECHNETIUM TC 99M SULFUR COLLOID FILTERED
1.0000 | Freq: Once | INTRAVENOUS | Status: AC | PRN
  Administered 2011-11-13: 1 via INTRADERMAL

## 2011-11-13 MED ORDER — SODIUM CHLORIDE 0.9 % IJ SOLN
INTRAMUSCULAR | Status: DC | PRN
  Administered 2011-11-13: 16:00:00 via INTRAMUSCULAR

## 2011-11-13 MED ORDER — FENTANYL CITRATE 0.05 MG/ML IJ SOLN
50.0000 ug | INTRAMUSCULAR | Status: DC | PRN
  Administered 2011-11-13: 50 ug via INTRAVENOUS

## 2011-11-13 MED ORDER — LORAZEPAM 2 MG/ML IJ SOLN: 1.0000 mg | Freq: Once | INTRAMUSCULAR | Status: DC | PRN

## 2011-11-13 MED ORDER — LIDOCAINE HCL (CARDIAC) 20 MG/ML IV SOLN
INTRAVENOUS | Status: DC | PRN
  Administered 2011-11-13: 50 mg via INTRAVENOUS

## 2011-11-13 MED ORDER — FENTANYL CITRATE 0.05 MG/ML IJ SOLN: 50.0000 ug | INTRAMUSCULAR | Status: DC | PRN

## 2011-11-13 MED ORDER — PROPOFOL 10 MG/ML IV EMUL
INTRAVENOUS | Status: DC | PRN
  Administered 2011-11-13: 160 mg via INTRAVENOUS

## 2011-11-13 MED ORDER — 0.9 % SODIUM CHLORIDE (POUR BTL) OPTIME
TOPICAL | Status: DC | PRN
  Administered 2011-11-13: 200 mL

## 2011-11-13 MED ORDER — VANCOMYCIN HCL IN DEXTROSE 1-5 GM/200ML-% IV SOLN
1000.0000 mg | INTRAVENOUS | Status: AC
  Administered 2011-11-13: 1000 mg via INTRAVENOUS

## 2011-11-13 MED ORDER — LACTATED RINGERS IV SOLN
INTRAVENOUS | Status: DC
  Administered 2011-11-13 (×2): via INTRAVENOUS

## 2011-11-13 SURGICAL SUPPLY — 58 items
APPLIER CLIP 9.375 MED OPEN (MISCELLANEOUS) ×2
BANDAGE ELASTIC 6 VELCRO ST LF (GAUZE/BANDAGES/DRESSINGS) IMPLANT
BINDER BREAST XLRG (GAUZE/BANDAGES/DRESSINGS) ×2 IMPLANT
BLADE SURG 10 STRL SS (BLADE) ×2 IMPLANT
BLADE SURG 15 STRL LF DISP TIS (BLADE) ×1 IMPLANT
BLADE SURG 15 STRL SS (BLADE) ×1
BLADE SURG ROTATE 9660 (MISCELLANEOUS) IMPLANT
CANISTER SUCTION 1200CC (MISCELLANEOUS) ×2 IMPLANT
CHLORAPREP W/TINT 26ML (MISCELLANEOUS) ×2 IMPLANT
CLIP APPLIE 9.375 MED OPEN (MISCELLANEOUS) ×1 IMPLANT
CLOTH BEACON ORANGE TIMEOUT ST (SAFETY) ×2 IMPLANT
COVER MAYO STAND STRL (DRAPES) ×2 IMPLANT
COVER PROBE W GEL 5X96 (DRAPES) ×2 IMPLANT
COVER TABLE BACK 60X90 (DRAPES) ×2 IMPLANT
DECANTER SPIKE VIAL GLASS SM (MISCELLANEOUS) IMPLANT
DERMABOND ADVANCED (GAUZE/BANDAGES/DRESSINGS) ×2
DERMABOND ADVANCED .7 DNX12 (GAUZE/BANDAGES/DRESSINGS) ×2 IMPLANT
DEVICE DUBIN W/COMP PLATE 8390 (MISCELLANEOUS) ×2 IMPLANT
DRAIN CHANNEL 19F RND (DRAIN) IMPLANT
DRAIN HEMOVAC 1/8 X 5 (WOUND CARE) IMPLANT
DRAPE LAPAROSCOPIC ABDOMINAL (DRAPES) ×2 IMPLANT
DRAPE UTILITY XL STRL (DRAPES) ×2 IMPLANT
ELECT COATED BLADE 2.86 ST (ELECTRODE) ×2 IMPLANT
ELECT REM PT RETURN 9FT ADLT (ELECTROSURGICAL) ×2
ELECTRODE REM PT RTRN 9FT ADLT (ELECTROSURGICAL) ×1 IMPLANT
EVACUATOR SILICONE 100CC (DRAIN) IMPLANT
GAUZE SPONGE 4X4 12PLY STRL LF (GAUZE/BANDAGES/DRESSINGS) IMPLANT
GLOVE BIO SURGEON STRL SZ7 (GLOVE) ×2 IMPLANT
GLOVE BIOGEL M STRL SZ7.5 (GLOVE) ×2 IMPLANT
GLOVE BIOGEL PI IND STRL 7.0 (GLOVE) ×1 IMPLANT
GLOVE BIOGEL PI IND STRL 8 (GLOVE) ×2 IMPLANT
GLOVE BIOGEL PI INDICATOR 7.0 (GLOVE) ×1
GLOVE BIOGEL PI INDICATOR 8 (GLOVE) ×2
GLOVE ECLIPSE 8.0 STRL XLNG CF (GLOVE) ×2 IMPLANT
GOWN PREVENTION PLUS XLARGE (GOWN DISPOSABLE) IMPLANT
HEMOSTAT SURGICEL 2X14 (HEMOSTASIS) IMPLANT
KIT MARKER MARGIN INK (KITS) ×2 IMPLANT
NDL SAFETY ECLIPSE 18X1.5 (NEEDLE) ×1 IMPLANT
NEEDLE HYPO 18GX1.5 SHARP (NEEDLE) ×1
NEEDLE HYPO 25X1 1.5 SAFETY (NEEDLE) ×4 IMPLANT
NS IRRIG 1000ML POUR BTL (IV SOLUTION) ×2 IMPLANT
PACK BASIN DAY SURGERY FS (CUSTOM PROCEDURE TRAY) ×2 IMPLANT
PENCIL BUTTON HOLSTER BLD 10FT (ELECTRODE) ×2 IMPLANT
PIN SAFETY STERILE (MISCELLANEOUS) IMPLANT
SLEEVE SCD COMPRESS KNEE MED (MISCELLANEOUS) ×2 IMPLANT
SPONGE LAP 18X18 X RAY DECT (DISPOSABLE) IMPLANT
SPONGE LAP 4X18 X RAY DECT (DISPOSABLE) ×2 IMPLANT
SUT ETHILON 3 0 PS 1 (SUTURE) IMPLANT
SUT MNCRL AB 3-0 PS2 18 (SUTURE) ×4 IMPLANT
SUT SILK 2 0 SH (SUTURE) IMPLANT
SUT VICRYL 3-0 CR8 SH (SUTURE) ×4 IMPLANT
SYR BULB 3OZ (MISCELLANEOUS) ×2 IMPLANT
SYR CONTROL 10ML LL (SYRINGE) ×4 IMPLANT
TOWEL OR 17X24 6PK STRL BLUE (TOWEL DISPOSABLE) ×2 IMPLANT
TOWEL OR NON WOVEN STRL DISP B (DISPOSABLE) ×2 IMPLANT
TUBE CONNECTING 20X1/4 (TUBING) ×2 IMPLANT
WATER STERILE IRR 1000ML POUR (IV SOLUTION) IMPLANT
YANKAUER SUCT BULB TIP NO VENT (SUCTIONS) ×2 IMPLANT

## 2011-11-13 NOTE — Anesthesia Procedure Notes (Signed)
Procedure Name: LMA Insertion Date/Time: 11/13/2011 3:47 PM Performed by: Lieutenant Diego Pre-anesthesia Checklist: Patient identified, Emergency Drugs available, Suction available and Patient being monitored Patient Re-evaluated:Patient Re-evaluated prior to inductionOxygen Delivery Method: Circle System Utilized Preoxygenation: Pre-oxygenation with 100% oxygen Intubation Type: IV induction Ventilation: Mask ventilation without difficulty LMA: LMA inserted LMA Size: 4.0 Number of attempts: 1 Airway Equipment and Method: bite block Placement Confirmation: positive ETCO2 and breath sounds checked- equal and bilateral Tube secured with: Tape Dental Injury: Teeth and Oropharynx as per pre-operative assessment

## 2011-11-13 NOTE — Discharge Instructions (Signed)
[facility] °Office Phone Number [phone] ° °BREAST BIOPSY/ PARTIAL MASTECTOMY: POST OP INSTRUCTIONS ° °Always review your discharge instruction sheet given to you by the facility where your surgery was performed. ° °IF YOU HAVE DISABILITY OR FAMILY LEAVE FORMS, YOU MUST BRING THEM TO THE OFFICE FOR PROCESSING.  DO NOT GIVE THEM TO YOUR DOCTOR. ° °1. A prescription for pain medication may be given to you upon discharge.  Take your pain medication as prescribed, if needed.  If narcotic pain medicine is not needed, then you may take acetaminophen (Tylenol) or ibuprofen (Advil) as needed. °2. Take your usually prescribed medications unless otherwise directed °3. If you need a refill on your pain medication, please contact your pharmacy.  They will contact our office to request authorization.  Prescriptions will not be filled after 5pm or on week-ends. °4. You should eat very light the first 24 hours after surgery, such as soup, crackers, pudding, etc.  Resume your normal diet the day after surgery. °5. Most patients will experience some swelling and bruising in the breast.  Ice packs and a good support bra will help.  Swelling and bruising can take several days to resolve.  °6. It is common to experience some constipation if taking pain medication after surgery.  Increasing fluid intake and taking a stool softener will usually help or prevent this problem from occurring.  A mild laxative (Milk of Magnesia or Miralax) should be taken according to package directions if there are no bowel movements after 48 hours. °7. Unless discharge instructions indicate otherwise, you may remove your bandages 24-48 hours after surgery, and you may shower at that time.  You may have steri-strips (small skin tapes) in place directly over the incision.  These strips should be left on the skin for 7-10 days.  If your surgeon used skin glue on the incision, you may shower in 24 hours.  The glue will flake off over the  next 2-3 weeks.  Any sutures or staples will be removed at the office during your follow-up visit. °8. ACTIVITIES:  You may resume regular daily activities (gradually increasing) beginning the next day.  Wearing a good support bra or sports bra minimizes pain and swelling.  You may have sexual intercourse when it is comfortable. °a. You may drive when you no longer are taking prescription pain medication, you can comfortably wear a seatbelt, and you can safely maneuver your car and apply brakes. °b. RETURN TO WORK:  ______________________________________________________________________________________ °9. You should see your doctor in the office for a follow-up appointment approximately two weeks after your surgery.  Your doctor’s nurse will typically make your follow-up appointment when she calls you with your pathology report.  Expect your pathology report 2-3 business days after your surgery.  You may call to check if you do not hear from us after three days. °10. OTHER INSTRUCTIONS: _______________________________________________________________________________________________ _____________________________________________________________________________________________________________________________________ °_____________________________________________________________________________________________________________________________________ °_____________________________________________________________________________________________________________________________________ ° °WHEN TO CALL YOUR DOCTOR: °1. Fever over 101.0 °2. Nausea and/or vomiting. °3. Extreme swelling or bruising. °4. Continued bleeding from incision. °5. Increased pain, redness, or drainage from the incision. ° °The clinic staff is available to answer your questions during regular business hours.  Please don’t hesitate to call and ask to speak to one of the nurses for clinical concerns.  If you have a medical emergency, go to the nearest  emergency room or call 911.  A surgeon from [facility] is always on call at the hospital. ° °For further questions, please visit [email or web address]  ° ° °  Post Anesthesia Home Care Instructions ° °Activity: °Get plenty of rest for the remainder of the day. A responsible adult should stay with you for 24 hours following the procedure.  °For the next 24 hours, DO NOT: °-Drive a car °-Operate machinery °-Drink alcoholic beverages °-Take any medication unless instructed by your physician °-Make any legal decisions or sign important papers. ° °Meals: °Start with liquid foods such as gelatin or soup. Progress to regular foods as tolerated. Avoid greasy, spicy, heavy foods. If nausea and/or vomiting occur, drink only clear liquids until the nausea and/or vomiting subsides. Call your physician if vomiting continues. ° °Special Instructions/Symptoms: °Your throat may feel dry or sore from the anesthesia or the breathing tube placed in your throat during surgery. If this causes discomfort, gargle with warm salt water. The discomfort should disappear within 24 hours. ° °

## 2011-11-13 NOTE — Progress Notes (Signed)
Emotional support during breast injections °

## 2011-11-13 NOTE — Anesthesia Preprocedure Evaluation (Signed)
Anesthesia Evaluation  Patient identified by MRN, date of birth, ID band Patient awake    Reviewed: Allergy & Precautions, H&P , NPO status , Patient's Chart, lab work & pertinent test results  History of Anesthesia Complications (+) PONV  Airway Mallampati: I TM Distance: >3 FB Neck ROM: Full    Dental   Pulmonary    Pulmonary exam normal       Cardiovascular hypertension, + angina     Neuro/Psych Anxiety Depression    GI/Hepatic hiatal hernia, GERD-  ,  Endo/Other  Diabetes mellitus-, Well Controlled, Type 1Hypothyroidism   Renal/GU      Musculoskeletal   Abdominal   Peds  Hematology   Anesthesia Other Findings   Reproductive/Obstetrics                           Anesthesia Physical Anesthesia Plan  ASA: III  Anesthesia Plan: General   Post-op Pain Management:    Induction: Intravenous  Airway Management Planned: LMA  Additional Equipment:   Intra-op Plan:   Post-operative Plan: Extubation in OR  Informed Consent: I have reviewed the patients History and Physical, chart, labs and discussed the procedure including the risks, benefits and alternatives for the proposed anesthesia with the patient or authorized representative who has indicated his/her understanding and acceptance.     Plan Discussed with: CRNA and Surgeon  Anesthesia Plan Comments:         Anesthesia Quick Evaluation

## 2011-11-13 NOTE — Transfer of Care (Signed)
Immediate Anesthesia Transfer of Care Note  Patient: Laura Cruz  Procedure(s) Performed: Procedure(s) (LRB): BREAST LUMPECTOMY WITH NEEDLE LOCALIZATION AND AXILLARY SENTINEL LYMPH NODE BX (Left)  Patient Location: PACU  Anesthesia Type: General  Level of Consciousness: sedated and pateint uncooperative  Airway & Oxygen Therapy: Patient Spontanous Breathing and Patient connected to face mask oxygen  Post-op Assessment: Report given to PACU RN and Post -op Vital signs reviewed and stable  Post vital signs: Reviewed and stable  Complications: No apparent anesthesia complications

## 2011-11-13 NOTE — H&P (View-Only) (Signed)
Patient ID: Laura Cruz, female   DOB: 05/11/1941, 71 y.o.   MRN: [id number]  No chief complaint on file.   HPI Laura Cruz is a 71 y.o. female.  Pt presents with abnormal left mammogram.  1 cm mass noted on mammogram and MRI. Core biopsy showed invasive mammary carcinoma  ER POS PR POS  HER 2 NEU NEGATIVE.  Pt sent at the request of Dr Stann Mainland.  Denies any history of mass,  Drainage or breast change. HPI  Past Medical History  Diagnosis Date  . Hypertension   . Diabetes mellitus   . Hypercholesteremia   . Arthritis   . Anxiety   . Depression   . Gastroesophageal reflux disease   . Hypothyroidism   . Hiatal hernia   . GERD (gastroesophageal reflux disease)   . Osteoarthritis   . Mass     Left ear, benign    Past Surgical History  Procedure Date  . Bilateral knee arthroscopy 2000, June 2007    Replacements  . Tonsillectomy   . Partial hysterectomy   . Hemorrhoid surgery   . Back surgery     2, pinched nerve  . US echocardiography 02-27-2009    Est EF 55-60%  . Cardiovascular stress test 02-24-2009    EF 68%    Family History  Problem Relation Age of Onset  . Diabetes Mother   . Hypertension Father   . Stroke Father   . Prostate cancer Father   . Coronary artery disease Father   . Hypertension Sister   . Hypertension Brother   . Hypertension Brother   . Lung cancer Sister     Social History History  Substance Use Topics  . Smoking status: Never Smoker   . Smokeless tobacco: Not on file  . Alcohol Use: No    Allergies  Allergen Reactions  . Morphine And Related Other (See Comments)    "Makes me crazy."  . Cephalexin Rash    Current Outpatient Prescriptions  Medication Sig Dispense Refill  . amLODipine (NORVASC) 2.5 MG tablet Take 2.5 mg by mouth daily.        Marland Kitchen aspirin 325 MG tablet Take 325 mg by mouth daily.        Marland Kitchen atenolol (TENORMIN) 50 MG tablet Take 50 mg by mouth daily.        Marland Kitchen atorvastatin (LIPITOR) 10 MG tablet Take 10 mg by mouth  daily.        Marland Kitchen buPROPion (WELLBUTRIN XL) 300 MG 24 hr tablet Take 300 mg by mouth daily.        . cyclobenzaprine (FLEXERIL) 10 MG tablet Take 10 mg by mouth 3 (three) times daily as needed.      Marland Kitchen estradiol (ESTRACE) 0.5 MG tablet Take 0.5 mg by mouth daily.       Marland Kitchen glucose blood test strip 1 each by Other route. Use as instructed       . HYDROcodone-acetaminophen (NORCO) 7.5-325 MG per tablet Take 1 tablet by mouth every 8 (eight) hours as needed.      Marland Kitchen HYDROcodone-homatropine (HYCODAN) 5-1.5 MG/5ML syrup Take 5 mLs by mouth every 6 (six) hours as needed.      . Lancets Misc. (UNISTIK 2 NORMAL) MISC by Does not apply route. Lancet Once a Day DX 250.00       . levothyroxine (SYNTHROID, LEVOTHROID) 125 MCG tablet Take 125 mcg by mouth daily.        Marland Kitchen lisinopril-hydrochlorothiazide (PRINZIDE,ZESTORETIC) 20-12.5 MG per  tablet Take 1 tablet by mouth daily.        . nitroGLYCERIN (NITROSTAT) 0.4 MG SL tablet Place 0.4 mg under the tongue every 5 (five) minutes as needed.        Marland Kitchen omeprazole (PRILOSEC) 20 MG capsule Take 20 mg by mouth daily.        . OXYCODONE HCL PO Take 10 mg by mouth every 6 (six) hours as needed.       . sertraline (ZOLOFT) 100 MG tablet Take 100 mg by mouth daily.        . simvastatin (ZOCOR) 10 MG tablet Take 10 mg by mouth at bedtime.      . traMADol (ULTRAM) 50 MG tablet Take 50 mg by mouth every 6 (six) hours as needed.        . traZODone (DESYREL) 100 MG tablet Take 100 mg by mouth at bedtime.         Review of Systems Review of Systems  Constitutional: Negative for fever, chills and unexpected weight change.  HENT: Negative for hearing loss, congestion, sore throat, trouble swallowing and voice change.   Eyes: Negative for visual disturbance.  Respiratory: Negative for cough and wheezing.   Cardiovascular: Negative for chest pain, palpitations and leg swelling.  Gastrointestinal: Negative for nausea, vomiting, abdominal pain, diarrhea, constipation, blood in stool,  abdominal distention and anal bleeding.  Genitourinary: Negative for hematuria, vaginal bleeding and difficulty urinating.  Musculoskeletal: Negative for arthralgias.  Skin: Negative for rash and wound.  Neurological: Negative for seizures, syncope and headaches.  Hematological: Negative for adenopathy. Does not bruise/bleed easily.  Psychiatric/Behavioral: Negative for confusion.    There were no vitals taken for this visit.  Physical Exam Physical Exam  Constitutional: She is oriented to person, place, and time. She appears well-developed and well-nourished.  HENT:  Head: Normocephalic and atraumatic.  Eyes: EOM are normal. Pupils are equal, round, and reactive to light.  Neck: Normal range of motion. Neck supple.  Cardiovascular: Normal rate and regular rhythm.   Pulmonary/Chest: Effort normal and breath sounds normal.       Bruising left breast lower inner quadrant.  No mass.  Axilla normal.  Right breast normal.  Right axilla normal.  Abdominal: Soft. Bowel sounds are normal.  Musculoskeletal: Normal range of motion.  Neurological: She is alert and oriented to person, place, and time.  Skin: Skin is warm and dry.  Psychiatric: She has a normal mood and affect. Her behavior is normal. Judgment and thought content normal.    Data Reviewed Left breast mass 1 cm central lower breast  ER pos PR POS HER 2 NEU NEGATIVE    Assessment    STAGE 1 LEFT BREAST CANCER    Plan    PT DESIRES BREAST CONSERVATION.  The procedure has been discussed with the patient. Alternatives to surgery have been discussed with the patient.  Risks of surgery include bleeding,  Infection,  Seroma formation, death,  and the need for further surgery.   The patient understands and wishes to proceed.Sentinel lymph node mapping and dissection has been discussed with the patient.  Risk of bleeding,  Infection,  Seroma formation,  Additional procedures,,  Shoulder weakness ,  Shoulder stiffness,  Nerve and blood  vessel injury and reaction to the mapping dyes have been discussed.  Alternatives to surgery have been discussed with the patient.  The patient agrees to proceed with left breast lumpectomy and SLN mapping.       Alisyn Lequire A. 10/23/2011,  2:46 PM

## 2011-11-13 NOTE — Op Note (Signed)
Left Breast Lumpectomy with left  Sentinal Node Biopsy Procedure Note  Indications: This patient presents with history of left breast cancer with clinically negative axillary lymph node exam.The procedure has been discussed with the patient. Alternatives to surgery have been discussed with the patient.  Risks of surgery include bleeding,  Infection,  Seroma formation, death,  and the need for further surgery.   The patient understands and wishes to proceed.Sentinel lymph node mapping and dissection has been discussed with the patient.  Risk of bleeding,  Infection,  Seroma formation,  Additional procedures,,  Shoulder weakness ,  Shoulder stiffness,  Nerve and blood vessel injury and reaction to the mapping dyes have been discussed.  Alternatives to surgery have been discussed with the patient.  The patient agrees to proceed.  Pre-operative Diagnosis: left breast cancer  Post-operative Diagnosis: left breast cancer  Surgeon: [name] A.   Assistants: OR  Anesthesia: General endotracheal anesthesia and Local anesthesia 0.25.% bupivacaine, with epinephrine  ASA Class: 3  Procedure Details  The patient was seen in the Holding Room. The risks, benefits, complications, treatment options, and expected outcomes were discussed with the patient. The possibilities of reaction to medication, pulmonary aspiration, bleeding, infection, the need for additional procedures, failure to diagnose a condition, and creating a complication requiring transfusion or operation were discussed with the patient. The patient concurred with the proposed plan, giving informed consent.  The site of surgery properly noted/marked. The patient was taken to Operating Room # 5, identified as Daine Gravel Odriscoll and the procedure verified as Breast Lumpectomy and Sentinal Node Biopsy. A Time Out was held and the above information confirmed. The left breast was injected with methylene blue dye admixed with saline in subareolar  position.  After induction of anesthesia, the left arm, breast, and chest were prepped and draped in standard fashion.  Using a hand-held gamma probe, axillary sentinel nodes were identified transcutaneously.  An oblique incision was created below the axillary hairline.  Dissection was carried through the clavipectoral fascia.  2 level 1 axillary sentinel nodes were removed and submitted to pathology revealing normal histologic findings  The axillary incision was closed with a 4-0 Vicryl subcuticular closure in layers.    The lumpectomy was performed by creating an oblique incision over the lower outer quadrant of the breast around the previously placed localization guidewire.  Dissection was carried down to the pectoral fascia.  The mass was removed and the  margins were inked.  Specimen radiography confirmed inclusion of the mammographic lesion.  Hemostasis was achieved with cautery.  The wound was irrigated and closed with a 4-0 Vicryl subcuticular closure in layers.    Sterile dressings were applied. At the end of the operation, all sponge, instrument, and needle counts were correct.  Findings: grossly clear surgical margins  Estimated Blood Loss:  less than 50 mL         Drains: none         Total IV Fluids:   500 ml         Specimens: above         Implants: none         Complications:  None; patient tolerated the procedure well.         Disposition: PACU - hemodynamically stable.         Condition: stable  Attending Attestation: I was present for the entire procedure.

## 2011-11-13 NOTE — Interval H&P Note (Signed)
History and Physical Interval Note:  11/13/2011 3:06 PM  Laura Cruz  has presented today for surgery, with the diagnosis of left breast cancer  The various methods of treatment have been discussed with the patient and family. After consideration of risks, benefits and other options for treatment, the patient has consented to  Procedure(s) (LRB): BREAST LUMPECTOMY WITH NEEDLE LOCALIZATION AND AXILLARY SENTINEL LYMPH NODE BX (Left) as a surgical intervention .  The patients' history has been reviewed, patient examined, no change in status, stable for surgery.  I have reviewed the patients' chart and labs.  Questions were answered to the patient's satisfaction.     Alfonse Garringer A.

## 2011-11-13 NOTE — Anesthesia Postprocedure Evaluation (Signed)
  Anesthesia Post-op Note  Patient: Laura Cruz  Procedure(s) Performed: Procedure(s) (LRB): BREAST LUMPECTOMY WITH NEEDLE LOCALIZATION AND AXILLARY SENTINEL LYMPH NODE BX (Left)  Patient Location: PACU  Anesthesia Type: General  Level of Consciousness: awake  Airway and Oxygen Therapy: Patient Spontanous Breathing  Post-op Pain: mild  Post-op Assessment: Post-op Vital signs reviewed, Patient's Cardiovascular Status Stable, Respiratory Function Stable, Patent Airway, No signs of Nausea or vomiting, Adequate PO intake and Pain level controlled  Post-op Vital Signs: stable  Complications: No apparent anesthesia complications

## 2011-11-18 ENCOUNTER — Emergency Department (HOSPITAL_COMMUNITY)
Admission: EM | Admit: 2011-11-18 | Discharge: 2011-11-18 | Disposition: A | Discharge status: Home / Self Care | Payer: Medicare Other | Attending: Emergency Medicine | Admitting: Emergency Medicine

## 2011-11-18 ENCOUNTER — Encounter (HOSPITAL_COMMUNITY): Payer: Self-pay | Admitting: Emergency Medicine

## 2011-11-18 ENCOUNTER — Telehealth (INDEPENDENT_AMBULATORY_CARE_PROVIDER_SITE_OTHER): Payer: Self-pay | Admitting: General Surgery

## 2011-11-18 DIAGNOSIS — T819XXA Unspecified complication of procedure, initial encounter: Secondary | ICD-10-CM

## 2011-11-18 DIAGNOSIS — E7800 Pure hypercholesterolemia, unspecified: Secondary | ICD-10-CM | POA: Insufficient documentation

## 2011-11-18 DIAGNOSIS — Y838 Other surgical procedures as the cause of abnormal reaction of the patient, or of later complication, without mention of misadventure at the time of the procedure: Secondary | ICD-10-CM | POA: Insufficient documentation

## 2011-11-18 DIAGNOSIS — I10 Essential (primary) hypertension: Secondary | ICD-10-CM | POA: Insufficient documentation

## 2011-11-18 DIAGNOSIS — IMO0002 Reserved for concepts with insufficient information to code with codable children: Secondary | ICD-10-CM | POA: Insufficient documentation

## 2011-11-18 DIAGNOSIS — F411 Generalized anxiety disorder: Secondary | ICD-10-CM | POA: Insufficient documentation

## 2011-11-18 DIAGNOSIS — F3289 Other specified depressive episodes: Secondary | ICD-10-CM | POA: Insufficient documentation

## 2011-11-18 DIAGNOSIS — E119 Type 2 diabetes mellitus without complications: Secondary | ICD-10-CM | POA: Insufficient documentation

## 2011-11-18 DIAGNOSIS — E039 Hypothyroidism, unspecified: Secondary | ICD-10-CM | POA: Insufficient documentation

## 2011-11-18 DIAGNOSIS — F329 Major depressive disorder, single episode, unspecified: Secondary | ICD-10-CM | POA: Insufficient documentation

## 2011-11-18 DIAGNOSIS — K219 Gastro-esophageal reflux disease without esophagitis: Secondary | ICD-10-CM | POA: Insufficient documentation

## 2011-11-18 LAB — BASIC METABOLIC PANEL
BUN: 20 mg/dL (ref 6–23)
Calcium: 9.5 mg/dL (ref 8.4–10.5)
Creatinine, Ser: 0.96 mg/dL (ref 0.50–1.10)
GFR calc non Af Amer: 58 mL/min — ABNORMAL LOW (ref >90)
Glucose, Bld: 105 mg/dL — ABNORMAL HIGH (ref 70–99)

## 2011-11-18 LAB — CBC
HCT: 33.3 % — ABNORMAL LOW (ref 36.0–46.0)
Hemoglobin: 11.2 g/dL — ABNORMAL LOW (ref 12.0–15.0)
MCH: 28.1 pg (ref 26.0–34.0)
MCHC: 33.6 g/dL (ref 30.0–36.0)

## 2011-11-18 MED ORDER — HYDROMORPHONE HCL PF 1 MG/ML IJ SOLN
0.5000 mg | Freq: Once | INTRAMUSCULAR | Status: AC
  Administered 2011-11-18: 0.5 mg via INTRAVENOUS
  Filled 2011-11-18: qty 1

## 2011-11-18 MED ORDER — OXYCODONE-ACETAMINOPHEN 5-325 MG PO TABS: 1.0000 | ORAL_TABLET | ORAL | Status: AC | PRN

## 2011-11-18 NOTE — Telephone Encounter (Signed)
Pt's daughter calling in for her Mom's path results and given.  Told that physician would answer any questions at her follow-up OV.

## 2011-11-18 NOTE — ED Notes (Signed)
Pt presenting to ed with c/o having lumpectomy last Wednesday and pt has swelling, pain and bleeding at the site. Pt states bleeding started today. Pt states her follow up appointment is scheduled for next week. Pt is alert and oriented at this time.

## 2011-11-18 NOTE — ED Notes (Signed)
Patient discharge via wheelchair. Respirations equal and unlabored. Skin warm and dry. No acute distress noted.

## 2011-11-18 NOTE — Telephone Encounter (Signed)
Pt called daughter to tell her when she removed her bra, she began to bleed from under her arm.  Recommended direct pressure be held and okay to use ice on it briefly.  Daughter called mom, then called back to say there is a large lump there and still bleeding.  Advised them to go to [facility] ER now for evaluation and control of bleeding.  Daughter understands and will take pt now.

## 2011-11-18 NOTE — ED Provider Notes (Signed)
History     CSN: [id number]  Arrival date & time 11/18/11  1644   First MD Initiated Contact with Patient 11/18/11 1756      Chief Complaint  Patient presents with  . Post-op Problem    (Consider location/radiation/quality/duration/timing/severity/associated sxs/prior treatment) The history is provided by the patient.   the patient is approximately 4 days postop from left breast lumpectomy and sentinel node dissection completed by Dr. Brantley Stage.  The patient reports she was doing well at home until this afternoon when she noticed some bleeding coming from the superior surgical site as well as development of a new hematoma in her left axilla.  She reports the pain is moderate at this time.  She tried her pain medicine at home without improvement.  She presented the ER for evaluation of her bleeding and her new mass in her left axilla.  She denies fevers or chills.  She denies redness.  The patient and family members report that the swelling is new since this afternoon.  The pain has become more severe this afternoon.  She denies fevers or chills  Past Medical History  Diagnosis Date  . Hypertension   . Hypercholesteremia   . Arthritis   . Anxiety   . Depression   . Gastroesophageal reflux disease   . Hypothyroidism   . GERD (gastroesophageal reflux disease)   . Osteoarthritis   . Mass     Left ear, benign  . PONV (postoperative nausea and vomiting)   . Angina   . H/O hiatal hernia   . Diabetes mellitus     diet controlled    Past Surgical History  Procedure Date  . Bilateral knee arthroscopy 2000, June 2007    Replacements  . Tonsillectomy   . Partial hysterectomy   . Hemorrhoid surgery   . Back surgery     2, pinched nerve  . US echocardiography 02-27-2009    Est EF 55-60%  . Cardiovascular stress test 02-24-2009    EF 68%  . Joint replacement   . Tonsillectomy     Family History  Problem Relation Age of Onset  . Diabetes Mother   . Hypertension Father   . Stroke  Father   . Prostate cancer Father   . Coronary artery disease Father   . Hypertension Sister   . Hypertension Brother   . Hypertension Brother   . Lung cancer Sister     History  Substance Use Topics  . Smoking status: Never Smoker   . Smokeless tobacco: Not on file  . Alcohol Use: No    OB History    Grav Para Term Preterm Abortions TAB SAB Ect Mult Living                  Review of Systems  All other systems reviewed and are negative.    Allergies  Morphine and related and Cephalexin  Home Medications   Current Outpatient Rx  Name Route Sig Dispense Refill  . AMLODIPINE BESYLATE 2.5 MG PO TABS Oral Take 2.5 mg by mouth daily.      . ASPIRIN 325 MG PO TABS Oral Take 325 mg by mouth daily.      . ATENOLOL 50 MG PO TABS Oral Take 50 mg by mouth daily.      . ATORVASTATIN CALCIUM 10 MG PO TABS Oral Take 10 mg by mouth daily.      . BUPROPION HCL ER (XL) 300 MG PO TB24 Oral Take 300 mg by mouth  daily.      Marland Kitchen HYDROCODONE-ACETAMINOPHEN 5-325 MG PO TABS Oral Take 1 tablet by mouth every 6 (six) hours as needed for pain. 30 tablet 0  . LEVOTHYROXINE SODIUM 125 MCG PO TABS Oral Take 125 mcg by mouth daily.      Marland Kitchen LISINOPRIL-HYDROCHLOROTHIAZIDE 20-12.5 MG PO TABS Oral Take 1 tablet by mouth daily.      Marland Kitchen NITROGLYCERIN 0.4 MG SL SUBL Sublingual Place 0.4 mg under the tongue every 5 (five) minutes as needed.      Marland Kitchen OMEPRAZOLE 20 MG PO CPDR Oral Take 20 mg by mouth daily.      . SERTRALINE HCL 100 MG PO TABS Oral Take 100 mg by mouth daily.      . TRAZODONE HCL 100 MG PO TABS Oral Take 100 mg by mouth at bedtime.     . OXYCODONE-ACETAMINOPHEN 5-325 MG PO TABS Oral Take 1 tablet by mouth every 4 (four) hours as needed for pain. 20 tablet 0    BP 161/70  Pulse 73  Temp(Src) 98.3 F (36.8 C) (Oral)  Resp 20  SpO2 96%  Physical Exam  Nursing note and vitals reviewed. Constitutional: She is oriented to person, place, and time. She appears well-developed and well-nourished. No  distress.  HENT:  Head: Normocephalic and atraumatic.  Eyes: EOM are normal.  Neck: Normal range of motion.  Cardiovascular: Normal rate, regular rhythm and normal heart sounds.   Pulmonary/Chest: Effort normal and breath sounds normal.  Abdominal: Soft. She exhibits no distension. There is no tenderness.  Musculoskeletal: Normal range of motion.       Superior surgical wound consistent with the patient's sentinel node biopsy has evidence of a small amount of bleeding from the medial aspect of the wound.  There is no active bleeding at this time.  There is a hematoma superior to this without erythema or fluctuance.  It is at golf ball sized.  The patient's inferior surgical wound on her left breast consistent with lumpectomy is without erythema or fluctuance or tenderness or drainage.  There is no pulsatile mass.  There is no evidence of expanding hematoma  Neurological: She is alert and oriented to person, place, and time.  Skin: Skin is warm and dry.  Psychiatric: She has a normal mood and affect. Judgment normal.    ED Course  Procedures (including critical care time)  Labs Reviewed  CBC - Abnormal; Notable for the following:    Hemoglobin 11.2 (*)    HCT 33.3 (*)    Platelets 127 (*)    All other components within normal limits  BASIC METABOLIC PANEL - Abnormal; Notable for the following:    Potassium 3.0 (*)    Glucose, Bld 105 (*)    GFR calc non Af Amer 58 (*)    GFR calc Af Amer 67 (*)    All other components within normal limits   No results found.   1. Post-operative complication       MDM  The patient was started with ice compress.  She was monitored in the emergency room her for several hours and appeared to have improvement in the size or hematoma.  There is no more bleeding present.  I discussed the case with the on-call surgeon Dr. Harlow Asa who agrees with the conservative treatment and observation the emergency department.  He will contact the operating surgeon  Dr. Brantley Stage and  attempt to have the office staff arrange close followup in the office tomorrow.  I've instructed  the patient and her husband to call the surgical office in the morning.  She understands to return the emergency apartment for new or worsening symptoms through the night.  She will continued the cool ice pack compress        Hoy Morn, MD 11/18/11 2053

## 2011-11-19 ENCOUNTER — Encounter (INDEPENDENT_AMBULATORY_CARE_PROVIDER_SITE_OTHER): Payer: Self-pay | Admitting: Surgery

## 2011-11-19 ENCOUNTER — Ambulatory Visit (INDEPENDENT_AMBULATORY_CARE_PROVIDER_SITE_OTHER): Payer: Medicare Other | Admitting: Surgery

## 2011-11-19 VITALS — BP 140/70 | HR 72 | Temp 97.6°F | Resp 12 | Wt 161.0 lb

## 2011-11-19 DIAGNOSIS — IMO0002 Reserved for concepts with insufficient information to code with codable children: Secondary | ICD-10-CM | POA: Insufficient documentation

## 2011-11-19 NOTE — Patient Instructions (Signed)
Hematoma A hematoma is a pocket of blood that collects under the skin, in an organ, in a body space, in a joint space, or in other tissue. The blood can clot to form a lump that you can see and feel. The lump is often firm, sore, and sometimes even painful and tender. Most hematomas get better in a few days to weeks. However, some hematomas may be serious and require medical care.Hematomas can range in size from very small to very large. CAUSES  A hematoma can be caused by a blunt or penetrating injury. It can also be caused by leakage from a blood vessel under the skin. Spontaneous leakage from a blood vessel is more likely to occur in elderly people, especially those taking blood thinners. Sometimes, a hematoma can develop after certain medical procedures. SYMPTOMS  Unlike a bruise, a hematoma forms a firm lump that you can feel. This lump is the collection of blood. The collection of blood can also cause your skin to turn a blue to dark blue color. If the hematoma is close to the surface of the skin, it often produces a yellowish color in the skin. DIAGNOSIS  Your caregiver can determine whether you have a hematoma based on your history and a physical exam. TREATMENT  Hematomas usually go away on their own over time. Rarely does the blood need to be drained out of the body. HOME CARE INSTRUCTIONS   Put ice on the injured area.   Put ice in a plastic bag.   Place a towel between your skin and the bag.   Leave the ice on for 15 to 20 minutes, 3 to 4 times a day for the first 1 to 2 days.  Elevate the injured area to help decrease pain and swelling. Wrapping the area with an elastic bandage may also be helpful. Compression helps to reduce swelling and promotes shrinking of the hematoma. Make sure the bandage is not wrapped too tight.   If your hematoma is on a lower extremity and is painful, crutches may be helpful for a couple days.   Only take over-the-counter or prescription medicines for  pain, discomfort, or fever as directed by your caregiver. Most patients can take acetaminophen or ibuprofen for the pain.  SEEK IMMEDIATE MEDICAL CARE IF:   You have increasing pain, or your pain is not controlled with medicine.   You have a fever.   You have worsening swelling or discoloration.   Your skin over the hematoma breaks or starts bleeding.  MAKE SURE YOU:   Understand these instructions.   Will watch your condition.   Will get help right away if you are not doing well or get worse.  Document Released: 01/16/2004 Document Revised: 05/23/2011 Document Reviewed: 02/04/2011 [facility] Patient Information 2012 [place].

## 2011-11-19 NOTE — Progress Notes (Signed)
Patient returns after lumpectomy and left sentinel lymph node mapping for breast cancer. Yesterday she developed swelling at the sentinel lymph node site and went to the emergency room was found to have a hematoma. She was observed and this did not get larger she was sent home. She comes in today for me to check her.  Exam: Left breast lumpectomy site is clean dry and intact. The left axillary lymph node biopsy site is swollen but not read. Small hematoma noted which is about golf ball size. It is not appear to be expanding. There is no leakage from the incision.  Impression: Postop hematoma from left axillary sentinel lymph node biopsy site. Final pathology is T1 N0 MX with negative margins. Return next week for recheck. I recommended ice packs and limiting the use of the extremity until I see her back. I told her daughter that if he becomes more red or drains that I can see  her back sooner but currently she has no signs of infection.  Hematoma care instructions given.

## 2011-11-22 ENCOUNTER — Telehealth (INDEPENDENT_AMBULATORY_CARE_PROVIDER_SITE_OTHER): Payer: Self-pay | Admitting: General Surgery

## 2011-11-22 NOTE — Telephone Encounter (Signed)
[name] contacted the office stating her mother is needing a refill on pain medication. Per Dr Brantley Stage okay to call out protocol pain meds. Notified Anne and called rx to [facility] Vicodin 5/325 disp #30 1q4-6 prn pain.

## 2011-11-25 ENCOUNTER — Telehealth (INDEPENDENT_AMBULATORY_CARE_PROVIDER_SITE_OTHER): Payer: Self-pay | Admitting: General Surgery

## 2011-11-25 ENCOUNTER — Other Ambulatory Visit (INDEPENDENT_AMBULATORY_CARE_PROVIDER_SITE_OTHER): Payer: Self-pay

## 2011-11-25 DIAGNOSIS — I890 Lymphedema, not elsewhere classified: Secondary | ICD-10-CM

## 2011-11-25 NOTE — Telephone Encounter (Signed)
Daughter calling for mother, to request "release" be FAXd to lymphedema clinic so she can attend the one next Monday.  She is having some throbbing in her upper arm and axilla; recommended ACE wrap for now.

## 2011-11-26 ENCOUNTER — Encounter: Payer: Self-pay | Admitting: Radiation Oncology

## 2011-11-27 ENCOUNTER — Ambulatory Visit
Admission: RE | Admit: 2011-11-27 | Discharge: 2011-11-27 | Disposition: A | Discharge status: Home / Self Care | Payer: Medicare Other | Source: Ambulatory Visit | Attending: Radiation Oncology | Admitting: Radiation Oncology

## 2011-11-27 ENCOUNTER — Encounter: Payer: Self-pay | Admitting: Radiation Oncology

## 2011-11-27 VITALS — BP 155/72 | HR 73 | Temp 98.2°F | Wt 164.4 lb

## 2011-11-27 VITALS — BP 155/72 | HR 73 | Temp 98.2°F | Wt 164.0 lb

## 2011-11-27 DIAGNOSIS — C50519 Malignant neoplasm of lower-outer quadrant of unspecified female breast: Secondary | ICD-10-CM

## 2011-11-27 DIAGNOSIS — C50912 Malignant neoplasm of unspecified site of left female breast: Secondary | ICD-10-CM

## 2011-11-27 NOTE — Progress Notes (Signed)
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     Underwent lumpectomy and sentinel lymph node biopsy on 11/13/2011. Zero out of two sentinel lymph nodes were positive. The lumpectomy specimen demonstrated grade 1 ductal carcinoma spanning 0.9 cm. There is no  LVSI. The margins were negative by at least 0.25 cm. There is no ductal carcinoma in situ in the specimen. Her cancer is ER and PR positive and HER-2/neu negative. Ki-67 was 3%.       She had a postoperative hematoma for which she was seen at the emergency room and subsequently by Dr. Brantley Stage. She still has soreness in her left breast. She was discussed this morning at our tumor Board and the consensus is that she would benefit from one adjuvant treatment but due to the favorable Characteristics of her tumor she does not necessarily need both radiation and chemotherapy.    ALLERGIES:  is allergic to morphine and related and cephalexin.  Meds: Current Outpatient Prescriptions  Medication Sig Dispense Refill  . amLODipine (NORVASC) 2.5 MG tablet Take 2.5 mg by mouth daily.        Marland Kitchen aspirin 325 MG tablet Take 325 mg by mouth daily.        Marland Kitchen atenolol (TENORMIN) 50 MG tablet Take 50 mg by mouth daily.        Marland Kitchen atorvastatin (LIPITOR) 10 MG tablet Take 10 mg by mouth daily.        Marland Kitchen buPROPion (WELLBUTRIN XL) 300 MG 24 hr tablet Take 300 mg by mouth daily.        Marland Kitchen HYDROcodone-acetaminophen (NORCO) 5-325 MG per tablet       . levothyroxine (SYNTHROID, LEVOTHROID) 125 MCG tablet Take 125 mcg by mouth daily.        Marland Kitchen lisinopril-hydrochlorothiazide (PRINZIDE,ZESTORETIC) 20-12.5 MG per tablet Take 1 tablet by mouth daily.        . nitroGLYCERIN (NITROSTAT) 0.4 MG SL tablet  Place 0.4 mg under the tongue every 5 (five) minutes as needed.        Marland Kitchen omeprazole (PRILOSEC) 20 MG capsule Take 20 mg by mouth daily.        Marland Kitchen oxyCODONE-acetaminophen (PERCOCET) 5-325 MG per tablet Take 1 tablet by mouth every 4 (four) hours as needed for pain.  20 tablet  0  . sertraline (ZOLOFT) 100 MG tablet Take 100 mg by mouth daily.        . traZODone (DESYREL) 100 MG tablet Take 100 mg by mouth at bedtime.        ROS: CONSTITUTIONAL: Loss of Sleep due to new, dry cough. Given cough medicine yesterday and "not working" and Pain Throbbing in left breast.  EYES:Glasses, Tearing left eyeg and Other Eye Problems Cataract left eye  EARS/NOSE/THROAT: Runny Nose and Dentures  HEART:None denies chest pain  LUNG: Shortness of Breath new presentation Walking and Coughing Dry  STOMACH/BOWEL: Reflux  GENITOURINARY: None and Erectile Dysfunction  BREASTS: Breast Cancer - Left  SKIN: Marked bruising noted left lateral breast and at axillary incision. Marked swelling above axillary incision  MUSCLE/BONES: Arthritis.  NERVOUS SYSTEM:hx of Migraines  MENTAL HEALTH:Anxiety and Depression  HORMONES/REPRODUCTIVE: Diabetes and Borderline Diabetic, Hypothyroidisim: Menses age 23, Parity age 34 ,  Hysterectomy age 87 for heavy menses'HRT x 15 years-stopped on 10/16/11  BLOOD/LYMPH SYSTEM: Blood Transfusion 50 years ago  IMMUNE: None   Physical Findings: The patient is in no acute distress. Patient is alert and oriented.  weight is 164 lb (74.39 kg). Her temperature is 98.2 F (36.8 C). Her blood pressure is 155/72 and her pulse is 73. .  She is sitting comfortably in a chair in no acute distress. Her neck is supple with no palpable cervical or supraclavicular lymphadenopathy. Her left breast is notable for significant ecchymoses and swelling around the axillary and lumpectomy scars. Her breast is tender but there does not appear to be any active infection.  Lab Findings: Lab Results  Component Value  Date   WBC 6.7 11/18/2011   HGB 11.2* 11/18/2011   HCT 33.3* 11/18/2011   MCV 83.5 11/18/2011   PLT 127* 11/18/2011    CMP     Component Value Date/Time   NA 138 11/18/2011 1939   K 3.0* 11/18/2011 1939   CL 101 11/18/2011 1939   CO2 27 11/18/2011 1939   GLUCOSE 105* 11/18/2011 1939   BUN 20 11/18/2011 1939   CREATININE 0.96 11/18/2011 1939   CALCIUM 9.5 11/18/2011 1939   PROT 6.9 11/08/2011 1135   ALBUMIN 3.9 11/08/2011 1135   AST 28 11/08/2011 1135   ALT 23 11/08/2011 1135   ALKPHOS 52 11/08/2011 1135   BILITOT 0.3 11/08/2011 1135   GFRNONAA 58* 11/18/2011 1939   GFRAA 67* 11/18/2011 1939      Radiographic Findings: Chest 2 View  11/08/2011  *RADIOLOGY REPORT*  Clinical Data: Nonproductive cough.  Diabetes and hypertension. Preop respiratory exam for left breast surgery.  CHEST - 2 VIEW  Comparison:  04/14/2006  Findings:  The heart size and mediastinal contours are within normal limits.  Both lungs are clear.  Midlung lower thoracic spine degenerative disc disease shows no significant interval change.  IMPRESSION: No active cardiopulmonary disease.  Original Report Authenticated By: Marlaine Hind, M.D.   Nm Sentinel Node Inj-no Rpt (breast)  11/13/2011  CLINICAL DATA: left breast cancer   Sulfur colloid was injected intradermally by the nuclear medicine  technologist for breast cancer sentinel node localization.     Mm Breast Surgical Specimen  11/13/2011  *RADIOLOGY REPORT*  Clinical Data:  Left breast cancer  LEFT BREAST NEEDLE LOCALIZATION WITH MAMMOGRAPHIC GUIDANCE AND SPECIMEN RADIOGRAPH  Patient presents for needle localization prior to surgical excision.  The patient and I discussed the procedure of needle localization including benefits and alternatives. We discussed the high likelihood of a successful procedure. We discussed the risks of the procedure, including infection, bleeding, tissue injury and further surgery. Informed written consent was given.  Using mammographic guidance, sterile technique,  2% lidocaine and a 9 cm modified Kopans needle, the mass in the outer portion of the left breast posteriorly was localized using a lateromedial approach. Films were labeled and sent with the patient to surgery. She tolerated the procedure well.  Specimen radiograph was performed at [place] and confirms the clip and wire to be present in the tissue sample. The images reviewed by Dr. Melanee Spry.  The specimen is marked for pathology.  IMPRESSION: Needle localization left breast.  No apparent complications.  Original Report Authenticated By: Chaney Born, M.D.   Mm Breast Wire Localization Left  11/13/2011  *RADIOLOGY REPORT*  Clinical Data:  Left breast cancer  LEFT BREAST NEEDLE LOCALIZATION WITH MAMMOGRAPHIC GUIDANCE AND SPECIMEN RADIOGRAPH  Patient  presents for needle localization prior to surgical excision.  The patient and I discussed the procedure of needle localization including benefits and alternatives. We discussed the high likelihood of a successful procedure. We discussed the risks of the procedure, including infection, bleeding, tissue injury and further surgery. Informed written consent was given.  Using mammographic guidance, sterile technique, 2% lidocaine and a 9 cm modified Kopans needle, the mass in the outer portion of the left breast posteriorly was localized using a lateromedial approach. Films were labeled and sent with the patient to surgery. She tolerated the procedure well.  Specimen radiograph was performed at [place] and confirms the clip and wire to be present in the tissue sample. The images reviewed by Dr. Melanee Spry.  The specimen is marked for pathology.  IMPRESSION: Needle localization left breast.  No apparent complications.  Original Report Authenticated By: Chaney Born, M.D.    Impression/plan: I explained to the patient's our consensus from tumor board this morning. She has a favorable risk profile for breast cancer. She understands  that she should consider 1 type of adjuvant treatment to minimize her chance chance of recurrence. We had a thorough discussion about her options for adjuvant therapy. One option would be antiestrogen therapy to be discussed further with medical oncology. She would take a pill for approximately 5 years. The alternative option would be radiotherapy to the breast. We discussed the risks benefits and side effects of radiotherapy.  She understands that the side effects would likely include some skin irritation and fatigue during the weeks of radiation. There is a risk of late effects which include but are not necessarily limited to cosmetic changes and rare lung and heart toxicity. We spoke about the latest technology that is used to minimize the risk of late effects for breast cancer patients undergoing radiotherapy.   The patient will be meeting soon with medical oncology to discuss antiestrogen therapy. Then she will make a decision. She signed a consent form for radiotherapy in case she ultimately decides to pursue that. The patient and her family have my contact information and I have asked them to call my clinic if she ultimately decide to pursue radiotherapy. They're satisfied with this plan.  -----------------------------------  Eppie Gibson, MD

## 2011-11-27 NOTE — Progress Notes (Signed)
[facility] Review Of Systems  Name: Laura Cruz MRN: [id number]  Date:  11/27/2011            DOB: 08-02-1940  Status:outpatient   Drug Allergies:  Allergies  Allergen Reactions  . Morphine And Related Other (See Comments)    "Makes me crazy."  . Cephalexin Rash    Symptoms since last visit at this office:  CONSTITUTIONAL: Loss of Sleep due to new, dry cough.  Given cough medicine yesterday and "not working" and Pain  Throbbing in left breast.  EYES:Glasses, Tearing left eyeg and Other Eye Problems Cataract left eye   EARS/NOSE/THROAT: Runny Nose and Dentures  HEART:None denies chest pain  LUNG: Shortness of Breath new presentation  Walking and Coughing  Dry  STOMACH/BOWEL: Reflux  GENITOURINARY: None and Erectile Dysfunction  BREASTS: Breast Cancer - Left  SKIN: Marked bruising noted left lateral breast and at axillary incision.  Marked swelling above axillary incision  MUSCLE/BONES: Arthritis.  NERVOUS SYSTEM:hx of Migraines  MENTAL HEALTH:Anxiety and Depression  HORMONES/REPRODUCTIVE: Diabetes and Borderline Diabetic, Hypothyroidisim: Menses age 36, Parity age 57 , Hysterectomy age 21 for heavy menses'HRT x 15 years-stopped on 10/16/11   BLOOD/LYMPH SYSTEM: Blood Transfusion 50 years ago  IMMUNE: None  ADDITIONAL INFORMATION/CONCERNS:

## 2011-11-29 ENCOUNTER — Encounter (INDEPENDENT_AMBULATORY_CARE_PROVIDER_SITE_OTHER): Payer: Self-pay | Admitting: Surgery

## 2011-11-29 ENCOUNTER — Ambulatory Visit (INDEPENDENT_AMBULATORY_CARE_PROVIDER_SITE_OTHER): Payer: Medicare Other | Admitting: Surgery

## 2011-11-29 VITALS — BP 152/68 | HR 70 | Temp 97.3°F | Resp 14 | Ht 63.0 in | Wt 162.1 lb

## 2011-11-29 DIAGNOSIS — Z9889 Other specified postprocedural states: Secondary | ICD-10-CM

## 2011-11-29 NOTE — Progress Notes (Signed)
Patient returns after a left breast lumpectomy was noted for stage I left breast cancer. She developed a hematoma at her sentinel lymph node site. This is getting better slowly.  exam: Left axilla hematoma smaller.  lumpectomy site intact and clean   Impression: Stage I left breast cancer status post breast conservation with postoperative hematoma  Plan: Return to weeks. Hematoma is improving.

## 2011-11-29 NOTE — Patient Instructions (Signed)
Return 2 weeks.

## 2011-12-02 ENCOUNTER — Other Ambulatory Visit: Payer: Medicare Other | Admitting: Lab

## 2011-12-02 ENCOUNTER — Other Ambulatory Visit (INDEPENDENT_AMBULATORY_CARE_PROVIDER_SITE_OTHER): Payer: Self-pay | Admitting: Surgery

## 2011-12-02 ENCOUNTER — Other Ambulatory Visit: Payer: Self-pay | Admitting: *Deleted

## 2011-12-02 ENCOUNTER — Ambulatory Visit: Payer: Medicare Other | Admitting: Oncology

## 2011-12-02 DIAGNOSIS — C50919 Malignant neoplasm of unspecified site of unspecified female breast: Secondary | ICD-10-CM

## 2011-12-04 ENCOUNTER — Telehealth (INDEPENDENT_AMBULATORY_CARE_PROVIDER_SITE_OTHER): Payer: Self-pay | Admitting: General Surgery

## 2011-12-04 NOTE — Telephone Encounter (Signed)
Pt calling to ask for one additional refill of Vicodin.  She was last refilled on 11/22/11 with 4 pills remaining.  She states she has some pain, especially with exercise, and "the doctor said it was probably a nerve that was close to where he did the surgery."  Pt informed that her surgeon is not available and we will need to ask one of his partners for permission to refill.  She understands this will not happen until later today.

## 2011-12-04 NOTE — Telephone Encounter (Signed)
Called pt to let her know that Dr. Marlou Starks has authorized an additional refill of Hydrocodone 5/325 mg, # 30, 1 po Q4-6H prn pain, no refill.  Called to [facility]:  [phone].  Pt is aware to pick-up.

## 2011-12-11 ENCOUNTER — Ambulatory Visit: Payer: Medicare Other | Admitting: Oncology

## 2011-12-11 ENCOUNTER — Ambulatory Visit: Admission: RE | Admit: 2011-12-11 | Payer: Medicare Other | Source: Ambulatory Visit | Admitting: Radiation Oncology

## 2011-12-11 ENCOUNTER — Ambulatory Visit: Payer: Medicare Other

## 2011-12-17 ENCOUNTER — Encounter (INDEPENDENT_AMBULATORY_CARE_PROVIDER_SITE_OTHER): Payer: Self-pay | Admitting: Surgery

## 2011-12-17 ENCOUNTER — Ambulatory Visit (INDEPENDENT_AMBULATORY_CARE_PROVIDER_SITE_OTHER): Payer: Medicare Other | Admitting: Surgery

## 2011-12-17 VITALS — BP 168/84 | HR 72 | Temp 97.4°F | Resp 14 | Ht 63.0 in | Wt 164.5 lb

## 2011-12-17 DIAGNOSIS — Z9889 Other specified postprocedural states: Secondary | ICD-10-CM

## 2011-12-17 NOTE — Progress Notes (Signed)
Patient returns in both her lumpectomy on the left and hematoma involving her sentinel lymph node site. She's doing much better.  On exam hematoma has almost completely resolved in the left axilla. Lumpectomy site clean dry and intact without signs of infection  Impression stage I left breast cancer status post breast conservation complicated by postop hematoma resolving  Plan: Okay to proceed with radiation therapy. Followup in 6 weeks.

## 2011-12-17 NOTE — Patient Instructions (Signed)
Ok to start radiation therapy.  Return 6 months

## 2011-12-30 ENCOUNTER — Ambulatory Visit
Admission: RE | Admit: 2011-12-30 | Discharge: 2011-12-30 | Disposition: A | Discharge status: Home / Self Care | Payer: Medicare Other | Source: Ambulatory Visit | Attending: Radiation Oncology | Admitting: Radiation Oncology

## 2011-12-30 DIAGNOSIS — C50519 Malignant neoplasm of lower-outer quadrant of unspecified female breast: Secondary | ICD-10-CM

## 2011-12-30 NOTE — Progress Notes (Signed)
Met with patient to discuss RO billing.   Dx: 174.9 Female Breast, NOS (excludes skin of breast T-173.5)   Attending Rad: Dr. Graylin Shiver Tx: [phone] Extrl Beam

## 2011-12-30 NOTE — Progress Notes (Signed)
Simulation / Treatment Planning Note  The patient has a diagnosis of  left breast cancer; she is status post lumpectomy.  She has opted against anti-estrogren therapy. She will receive whole breast radiotherapy. The patient was laid in the supine position on the treatment table with her arms over her head. Her head was in an Accuform device. I placed adhesive wiring over her lumpectomy scar and around the borders of her left breast tissue . High-resolution CT axial imaging was obtained of the patient's chest. An isocenter was placed in her anterior left lung. Skin markings were made and she tolerated the procedure well without any complications.  The patient was then scanned in the CT simulator while holding her breath. The patient did have trouble holding her breath steadily.  We will therefore not treat her with the breath-hold technique and base her planning on the free breathing images.   Treatment planning note: the patient will be treated with opposed tangential fields using MLCs for custom blocks. I plan to prescribe 50 Gray in 25 fractions to the whole breast.

## 2012-01-06 ENCOUNTER — Ambulatory Visit
Admission: RE | Admit: 2012-01-06 | Discharge: 2012-01-06 | Disposition: A | Discharge status: Home / Self Care | Payer: Medicare Other | Source: Ambulatory Visit | Attending: Radiation Oncology | Admitting: Radiation Oncology

## 2012-01-06 ENCOUNTER — Encounter: Payer: Self-pay | Admitting: Radiation Oncology

## 2012-01-06 NOTE — Progress Notes (Signed)
Simulation verification note:  The patient underwent simulation verification for treatment to her left breast. Her isocenter is in good position and the multileaf collimators contour the treatment volume appropriately.

## 2012-01-07 ENCOUNTER — Ambulatory Visit
Admission: RE | Admit: 2012-01-07 | Discharge: 2012-01-07 | Disposition: A | Discharge status: Home / Self Care | Payer: Medicare Other | Source: Ambulatory Visit | Attending: Radiation Oncology | Admitting: Radiation Oncology

## 2012-01-08 ENCOUNTER — Ambulatory Visit
Admission: RE | Admit: 2012-01-08 | Discharge: 2012-01-08 | Disposition: A | Discharge status: Home / Self Care | Payer: Medicare Other | Source: Ambulatory Visit | Attending: Radiation Oncology | Admitting: Radiation Oncology

## 2012-01-09 ENCOUNTER — Ambulatory Visit
Admission: RE | Admit: 2012-01-09 | Discharge: 2012-01-09 | Disposition: A | Discharge status: Home / Self Care | Payer: Medicare Other | Source: Ambulatory Visit | Attending: Radiation Oncology | Admitting: Radiation Oncology

## 2012-01-09 DIAGNOSIS — C50519 Malignant neoplasm of lower-outer quadrant of unspecified female breast: Secondary | ICD-10-CM

## 2012-01-09 MED ORDER — RADIAPLEXRX EX GEL
Freq: Once | CUTANEOUS | Status: AC
  Administered 2012-01-09: 19:00:00 via TOPICAL

## 2012-01-09 MED ORDER — ALRA NON-METALLIC DEODORANT (RAD-ONC)
1.0000 "application " | Freq: Once | TOPICAL | Status: AC
  Administered 2012-01-09: 1 via TOPICAL

## 2012-01-09 NOTE — Progress Notes (Signed)
1800 Received patient in the clinic tonight following treatment. Patient is alert and oriented to person, place, and time. No distress noted. Steady gait noted. Pleasant affect noted. Patient denies pain at this time. Oriented patient to staff and routine of the clinic. Provided patient with RADIATION THERAPY AND YOU handbook then, reviewed pertinent information. Also, provided patient with Radiaplex and Alra. Educated patient upon use of these products. Educated patient on potential side effects and management. Provided patient with this writer's business card and encouraged to call with needs. All questions answered. Patient verbalized understanding of all things reviewed.

## 2012-01-10 ENCOUNTER — Ambulatory Visit
Admission: RE | Admit: 2012-01-10 | Discharge: 2012-01-10 | Disposition: A | Discharge status: Home / Self Care | Payer: Medicare Other | Source: Ambulatory Visit | Attending: Radiation Oncology | Admitting: Radiation Oncology

## 2012-01-13 ENCOUNTER — Ambulatory Visit
Admission: RE | Admit: 2012-01-13 | Discharge: 2012-01-13 | Disposition: A | Discharge status: Home / Self Care | Payer: Medicare Other | Source: Ambulatory Visit | Attending: Radiation Oncology | Admitting: Radiation Oncology

## 2012-01-13 ENCOUNTER — Encounter: Payer: Self-pay | Admitting: Radiation Oncology

## 2012-01-13 VITALS — BP 165/85 | HR 64 | Resp 18 | Wt 161.9 lb

## 2012-01-13 DIAGNOSIS — C50519 Malignant neoplasm of lower-outer quadrant of unspecified female breast: Secondary | ICD-10-CM

## 2012-01-13 NOTE — Progress Notes (Signed)
Patient presents to the clinic today accompanied by her husband for a PUT with Dr. Isidore Moos. Patient is alert and oriented to person, place, and time. No distress noted. Steady gait noted. Pleasant affect noted. Patient denies pain at this time. However, patient reports an occasional brief sharp shooting pain. Patient expressed relief to learn the pain was related to healing. Patient denies skin changes to treatment area. Patient reports using Radiaplex as directed. Patient reports an occasional headache related to sinuses. Also, patient reports an occasional dry cough related to sinuses. Reported all findings to Dr. Isidore Moos.

## 2012-01-13 NOTE — Progress Notes (Signed)
   Weekly Management Note Current Dose:  10 Gy  Projected Dose: 50 Gy   Narrative:  The patient presents for routine under treatment assessment.  CBCT/MVCT images/Port film x-rays were reviewed.  The chart was checked. Doing well, has occasional shooting pains through her left breast.  Physical Findings:  weight is 161 lb 14.4 oz (73.437 kg). Her blood pressure is 165/85 and her pulse is 64. Her respiration is 18.  no acute distress. Skin exam deferred.  Impression:  The patient is tolerating radiotherapy.  Plan:  Continue radiotherapy as planned. Reassurance provided regarding the shooting pains, which are common Considering her past surgery and her history.  ________________________________   Eppie Gibson, M.D.

## 2012-01-14 ENCOUNTER — Ambulatory Visit
Admission: RE | Admit: 2012-01-14 | Discharge: 2012-01-14 | Disposition: A | Discharge status: Home / Self Care | Payer: Medicare Other | Source: Ambulatory Visit | Attending: Radiation Oncology | Admitting: Radiation Oncology

## 2012-01-15 ENCOUNTER — Ambulatory Visit
Admission: RE | Admit: 2012-01-15 | Discharge: 2012-01-15 | Disposition: A | Discharge status: Home / Self Care | Payer: Medicare Other | Source: Ambulatory Visit | Attending: Radiation Oncology | Admitting: Radiation Oncology

## 2012-01-16 ENCOUNTER — Ambulatory Visit
Admission: RE | Admit: 2012-01-16 | Discharge: 2012-01-16 | Disposition: A | Discharge status: Home / Self Care | Payer: Medicare Other | Source: Ambulatory Visit | Attending: Radiation Oncology | Admitting: Radiation Oncology

## 2012-01-17 ENCOUNTER — Ambulatory Visit
Admission: RE | Admit: 2012-01-17 | Discharge: 2012-01-17 | Disposition: A | Discharge status: Home / Self Care | Payer: Medicare Other | Source: Ambulatory Visit | Attending: Radiation Oncology | Admitting: Radiation Oncology

## 2012-01-20 ENCOUNTER — Ambulatory Visit
Admission: RE | Admit: 2012-01-20 | Discharge: 2012-01-20 | Disposition: A | Discharge status: Home / Self Care | Payer: Medicare Other | Source: Ambulatory Visit | Attending: Radiation Oncology | Admitting: Radiation Oncology

## 2012-01-20 ENCOUNTER — Encounter: Payer: Self-pay | Admitting: Radiation Oncology

## 2012-01-20 VITALS — BP 183/72 | HR 65 | Resp 18 | Wt 165.1 lb

## 2012-01-20 DIAGNOSIS — C50519 Malignant neoplasm of lower-outer quadrant of unspecified female breast: Secondary | ICD-10-CM

## 2012-01-20 NOTE — Progress Notes (Signed)
Patient presents to the clinic today accompanied by her husband for a PUT with Dr. Isidore Moos. Patient is alert and oriented to person, place, and time. No distress noted. Steady gait noted. Pleasant affect noted. Patient denies pain at this time. However, patient reports an occasional sharp shooting pain in her left/treated breast. Patient denies skin changes to left/treated breast. Patient reports using radiaplex daily. Patient has no complaints at this time. Reported all findings to Dr. Isidore Moos.

## 2012-01-20 NOTE — Progress Notes (Signed)
   Weekly Management Note Current Dose:  20 Gy  Projected Dose:  50 Gy   Narrative:  The patient presents for routine under treatment assessment.  CBCT/MVCT images/Port film x-rays were reviewed.  The chart was checked. Patient reports occasional sharp shooting pains in her left breast. Minimal complaints otherwise.  Physical Findings:  weight is 165 lb 1.6 oz (74.889 kg). Her blood pressure is 183/72 and her pulse is 65. Her respiration is 18.  left breast demonstrates minimal erythema thus far.  Impression:  The patient is tolerating radiotherapy.  Plan:  Continue radiotherapy as planned.  ________________________________   Eppie Gibson, M.D.

## 2012-01-21 ENCOUNTER — Ambulatory Visit
Admission: RE | Admit: 2012-01-21 | Discharge: 2012-01-21 | Disposition: A | Discharge status: Home / Self Care | Payer: Medicare Other | Source: Ambulatory Visit | Attending: Radiation Oncology | Admitting: Radiation Oncology

## 2012-01-21 DIAGNOSIS — C50519 Malignant neoplasm of lower-outer quadrant of unspecified female breast: Secondary | ICD-10-CM | POA: Insufficient documentation

## 2012-01-22 ENCOUNTER — Ambulatory Visit
Admission: RE | Admit: 2012-01-22 | Discharge: 2012-01-22 | Disposition: A | Discharge status: Home / Self Care | Payer: Medicare Other | Source: Ambulatory Visit | Attending: Radiation Oncology | Admitting: Radiation Oncology

## 2012-01-23 ENCOUNTER — Ambulatory Visit
Admission: RE | Admit: 2012-01-23 | Discharge: 2012-01-23 | Disposition: A | Discharge status: Home / Self Care | Payer: Medicare Other | Source: Ambulatory Visit | Attending: Radiation Oncology | Admitting: Radiation Oncology

## 2012-01-24 ENCOUNTER — Ambulatory Visit
Admission: RE | Admit: 2012-01-24 | Discharge: 2012-01-24 | Disposition: A | Discharge status: Home / Self Care | Payer: Medicare Other | Source: Ambulatory Visit | Attending: Radiation Oncology | Admitting: Radiation Oncology

## 2012-01-27 ENCOUNTER — Encounter: Payer: Self-pay | Admitting: Radiation Oncology

## 2012-01-27 ENCOUNTER — Ambulatory Visit
Admission: RE | Admit: 2012-01-27 | Discharge: 2012-01-27 | Disposition: A | Discharge status: Home / Self Care | Payer: Medicare Other | Source: Ambulatory Visit | Attending: Radiation Oncology | Admitting: Radiation Oncology

## 2012-01-27 VITALS — BP 167/75 | HR 107 | Resp 18 | Wt 164.3 lb

## 2012-01-27 DIAGNOSIS — C50519 Malignant neoplasm of lower-outer quadrant of unspecified female breast: Secondary | ICD-10-CM

## 2012-01-27 NOTE — Progress Notes (Signed)
   Weekly Management Note Current Dose:  30 Gy  Projected Dose:  50 Gy   Narrative:  The patient presents for routine under treatment assessment.  CBCT/MVCT images/Port film x-rays were reviewed.  The chart was checked. She reports occasional sharp shooting left breast pain. The inframammary fold of her left breast itches a little bit. Her energy is decreased.   Physical Findings:  weight is 164 lb 4.8 oz (74.526 kg). Her blood pressure is 167/75 and her pulse is 107. Her respiration is 18.   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 there is a seroma just above the axillary lymph node biopsy scar. There is no significant erythema or discharge from this area.   Impression:  The patient is tolerating radiotherapy.  Plan:  Continue radiotherapy as planned. I do not think a seroma is infected. We'll continue to watch this. Patient was recommended to continue radiaplex and to apply hydrocortisone cream to the areas of dermatitis. The patient that I will be on vacation next week so my partner, Dr. Valere Dross, will see her. One-month followup has been ordered .  ________________________________   Eppie Gibson, M.D.

## 2012-01-27 NOTE — Progress Notes (Signed)
Patient presents to the clinic today unaccompanied for a PUT with Dr. Isidore Moos. Patient is alert and oriented to person, place, and time. No distress noted. Steady gait noted. Pleasant affect noted. Patient denies pain at this. However, patient reports occasional sharp shooting brief left breast pain. Patient verbalizes that she understand this sharp pain is related to healing from breast surgery. Mild radiation dermatitis noted under left/treated breast. Patient reports this area itches. Encouraged patient to apply hydrocortisone to the areas that itch in conjunction with the Radiaplex. Patient reports using Radiaplex bid as directed. Collection of fluid noted under left axilla. Patient reports decline in energy level recently. Reported all findings to Dr. Isidore Moos.

## 2012-01-28 ENCOUNTER — Ambulatory Visit
Admission: RE | Admit: 2012-01-28 | Discharge: 2012-01-28 | Disposition: A | Discharge status: Home / Self Care | Payer: Medicare Other | Source: Ambulatory Visit | Attending: Radiation Oncology | Admitting: Radiation Oncology

## 2012-01-29 ENCOUNTER — Ambulatory Visit
Admission: RE | Admit: 2012-01-29 | Discharge: 2012-01-29 | Disposition: A | Discharge status: Home / Self Care | Payer: Medicare Other | Source: Ambulatory Visit | Attending: Radiation Oncology | Admitting: Radiation Oncology

## 2012-01-30 ENCOUNTER — Ambulatory Visit
Admission: RE | Admit: 2012-01-30 | Discharge: 2012-01-30 | Disposition: A | Discharge status: Home / Self Care | Payer: Medicare Other | Source: Ambulatory Visit | Attending: Radiation Oncology | Admitting: Radiation Oncology

## 2012-01-31 ENCOUNTER — Ambulatory Visit
Admission: RE | Admit: 2012-01-31 | Discharge: 2012-01-31 | Disposition: A | Discharge status: Home / Self Care | Payer: Medicare Other | Source: Ambulatory Visit | Attending: Radiation Oncology | Admitting: Radiation Oncology

## 2012-02-03 ENCOUNTER — Encounter: Payer: Self-pay | Admitting: Radiation Oncology

## 2012-02-03 ENCOUNTER — Ambulatory Visit
Admission: RE | Admit: 2012-02-03 | Discharge: 2012-02-03 | Disposition: A | Discharge status: Home / Self Care | Payer: Medicare Other | Source: Ambulatory Visit | Attending: Radiation Oncology | Admitting: Radiation Oncology

## 2012-02-03 VITALS — BP 148/81 | HR 66 | Temp 97.1°F | Wt 165.5 lb

## 2012-02-03 DIAGNOSIS — C50519 Malignant neoplasm of lower-outer quadrant of unspecified female breast: Secondary | ICD-10-CM

## 2012-02-03 NOTE — Progress Notes (Signed)
Weekly Management Note:  Site:L Breast Current Dose:  4000  cGy Projected Dose: 5000  cGy  Narrative: The patient is seen today for routine under treatment assessment. CBCT/MVCT images/port films were reviewed. The chart was reviewed.   No complaints today except for mild left breast discomfort. She uses Biafine cream. Physical Examination:  Filed Vitals:   02/03/12 1145  BP: 148/81  Pulse: 66  Temp: 97.1 F (36.2 C)  .  Weight: 165 lb 8 oz (75.07 kg). There is moderate erythema the skin along the left breast, particularly the inframammary region. No areas of desquamation.     Impression: Tolerating radiation therapy well.  Plan: Continue radiation therapy as planned.

## 2012-02-03 NOTE — Progress Notes (Signed)
20/25 fractions to left breast.   Erythema noted left breast especially in the inframmary fold.  Skin intact.  C/o increased tenderness in breast.  Reports that since the start of treatment she" tosses and turns" and takes naps during the day.  No other voiced concerns today.

## 2012-02-04 ENCOUNTER — Ambulatory Visit
Admission: RE | Admit: 2012-02-04 | Discharge: 2012-02-04 | Disposition: A | Discharge status: Home / Self Care | Payer: Medicare Other | Source: Ambulatory Visit | Attending: Radiation Oncology | Admitting: Radiation Oncology

## 2012-02-05 ENCOUNTER — Ambulatory Visit
Admission: RE | Admit: 2012-02-05 | Discharge: 2012-02-05 | Disposition: A | Discharge status: Home / Self Care | Payer: Medicare Other | Source: Ambulatory Visit | Attending: Radiation Oncology | Admitting: Radiation Oncology

## 2012-02-06 ENCOUNTER — Ambulatory Visit
Admission: RE | Admit: 2012-02-06 | Discharge: 2012-02-06 | Disposition: A | Discharge status: Home / Self Care | Payer: Medicare Other | Source: Ambulatory Visit | Attending: Radiation Oncology | Admitting: Radiation Oncology

## 2012-02-07 ENCOUNTER — Ambulatory Visit
Admission: RE | Admit: 2012-02-07 | Discharge: 2012-02-07 | Disposition: A | Discharge status: Home / Self Care | Payer: Medicare Other | Source: Ambulatory Visit | Attending: Radiation Oncology | Admitting: Radiation Oncology

## 2012-02-10 ENCOUNTER — Encounter: Payer: Self-pay | Admitting: Radiation Oncology

## 2012-02-10 ENCOUNTER — Ambulatory Visit
Admission: RE | Admit: 2012-02-10 | Discharge: 2012-02-10 | Disposition: A | Discharge status: Home / Self Care | Payer: Medicare Other | Source: Ambulatory Visit | Attending: Radiation Oncology | Admitting: Radiation Oncology

## 2012-02-10 VITALS — BP 175/73 | HR 64 | Temp 97.6°F | Resp 20 | Wt 165.7 lb

## 2012-02-10 DIAGNOSIS — C50519 Malignant neoplasm of lower-outer quadrant of unspecified female breast: Secondary | ICD-10-CM

## 2012-02-10 MED ORDER — BIAFINE EX EMUL
Freq: Two times a day (BID) | CUTANEOUS | Status: DC
  Administered 2012-02-10: 15:00:00 via TOPICAL

## 2012-02-10 NOTE — Progress Notes (Signed)
Pt completed tx today, has FU appt scheduled. Pt c/o fatigue, itching of skin left breast, pain, soreness, tenderness in breast at times. She takes Tylenol prn, occasionally takes Hydrocodone. Gave pt Biafine for itchiness, advised Neosporin for small area of moist desquamation under breast.

## 2012-02-10 NOTE — Progress Notes (Signed)
[facility]     Rexene Edison, M.D. [place], [place] [address]               Blair Promise, M.D., Ph.D. Phone: [phone]      [place] A. Tammi Klippel, M.D. [phone]      Jodelle Gross, M.D., Ph.D.         Thea Silversmith, M.D.         Wyvonnia Lora, M.D Weekly Treatment Management Note  Name: Laura Cruz     MRN: [id number]        CSN: [id number] Date: 02/10/2012      DOB: 11/10/1940  CC: [name], MD         Brigitte Pulse    Status: Outpatient  Diagnosis: The encounter diagnosis was Carcinoma of lower outer quadrant of breast.  Current Dose: 50.0 Gy  Current Fraction: 25  Planned Dose: 50.0 Gy  Narrative: [name] was seen today for weekly treatment management. The chart was checked and port films  were reviewed. She does have some fatigue as well as itching within the skin of the left breast. She also has some pain and soreness. She occasionally will use hydrocortisone cream for her skin. Patient has been given Biafine also.  Morphine and related and Cephalexin   Current Outpatient Prescriptions  Medication Sig Dispense Refill  . amLODipine (NORVASC) 2.5 MG tablet Take 2.5 mg by mouth daily.        Marland Kitchen aspirin 325 MG tablet Take 325 mg by mouth daily.        Marland Kitchen atenolol (TENORMIN) 50 MG tablet Take 50 mg by mouth daily.        Marland Kitchen atorvastatin (LIPITOR) 10 MG tablet Take 10 mg by mouth daily.        Marland Kitchen buPROPion (WELLBUTRIN XL) 300 MG 24 hr tablet Take 300 mg by mouth daily.        . cefUROXime (CEFTIN) 250 MG tablet       . emollient (BIAFINE) cream Apply topically 2 (two) times daily.      Marland Kitchen HYDROcodone-acetaminophen (NORCO) 5-325 MG per tablet TAKE 1 TABLET BY MOUTH EVERY 6 HOURS AS NEEDED FOR PAIN  30 tablet  0  . levothyroxine (SYNTHROID, LEVOTHROID) 125 MCG tablet Take 125 mcg by mouth daily.        Marland Kitchen losartan-hydrochlorothiazide (HYZAAR) 50-12.5 MG per tablet Daily.      . nitroGLYCERIN (NITROSTAT)  0.4 MG SL tablet Place 0.4 mg under the tongue every 5 (five) minutes as needed.        . non-metallic deodorant Jethro Poling) MISC Apply 1 application topically daily as needed.      Marland Kitchen omeprazole (PRILOSEC) 20 MG capsule Take 20 mg by mouth daily.        . OXYCONTIN 10 MG 12 hr tablet       . sertraline (ZOLOFT) 100 MG tablet Take 100 mg by mouth daily.        . traZODone (DESYREL) 100 MG tablet Take 100 mg by mouth at bedtime.       . Wound Cleansers (RADIAPLEX EX) Apply topically.       Current Facility-Administered Medications  Medication Dose Route Frequency Provider Last Rate Last Dose  . topical emolient (BIAFINE) emulsion   Topical BID Blair Promise, MD       Labs:  Lab Results  Component Value Date   WBC 6.7 11/18/2011  HGB 11.2* 11/18/2011   HCT 33.3* 11/18/2011   MCV 83.5 11/18/2011   PLT 127* 11/18/2011   Lab Results  Component Value Date   CREATININE 0.96 11/18/2011   BUN 20 11/18/2011   NA 138 11/18/2011   K 3.0* 11/18/2011   CL 101 11/18/2011   CO2 27 11/18/2011   Lab Results  Component Value Date   ALT 23 11/08/2011   AST 28 11/08/2011   BILITOT 0.3 11/08/2011    Physical Examination:  weight is 165 lb 11.2 oz (75.161 kg). Her oral temperature is 97.6 F (36.4 C). Her blood pressure is 175/73 and her pulse is 64. Her respiration is 20.    Wt Readings from Last 3 Encounters:  02/10/12 165 lb 11.2 oz (75.161 kg)  02/03/12 165 lb 8 oz (75.07 kg)  01/27/12 164 lb 4.8 oz (74.526 kg)     Lungs - Normal respiratory effort, chest expands symmetrically. Lungs are clear to auscultation, no crackles or wheezes.  Heart has regular rhythm and rate  Abdomen is soft and non tender with normal bowel sounds Exam shows a small area of moist desquamation in the inframammary fold of the left breast. she was advised to use Neosporin ointment for this area and to use Biafine for the remainder of the breast  Assessment:  Patient tolerated treatments recently well except for issues as above  Plan:  Routine followup in one month with Dr. Isidore Moos   -----------------------------------  Blair Promise, PhD, MD

## 2012-02-11 ENCOUNTER — Ambulatory Visit: Payer: Medicare Other

## 2012-02-11 NOTE — Progress Notes (Signed)
  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    Plan: The patient has completed radiation treatment. The patient will return to radiation oncology clinic for routine followup in one month. I advised them to call or return sooner if they have any questions or concerns related to their recovery or treatment.  -----------------------------------  Eppie Gibson, MD

## 2012-02-12 ENCOUNTER — Encounter (INDEPENDENT_AMBULATORY_CARE_PROVIDER_SITE_OTHER): Payer: Self-pay | Admitting: Surgery

## 2012-02-12 ENCOUNTER — Ambulatory Visit (INDEPENDENT_AMBULATORY_CARE_PROVIDER_SITE_OTHER): Payer: Medicare Other | Admitting: Surgery

## 2012-02-12 VITALS — BP 150/76 | HR 68 | Temp 96.8°F | Ht 64.0 in | Wt 164.6 lb

## 2012-02-12 DIAGNOSIS — Z9889 Other specified postprocedural states: Secondary | ICD-10-CM

## 2012-02-12 NOTE — Patient Instructions (Signed)
benedryl tablets for itching as directed.  Return 6 months.

## 2012-02-12 NOTE — Progress Notes (Signed)
Patient returns in both her lumpectomy on the left and hematoma involving her sentinel lymph node site. She's doing much better. Complains of itching from radiation.  On exam hematoma has almost completely resolved in the left axilla. Lumpectomy site clean dry and intact without signs of infection breast id red secondary to radiation.  Small area of breakdown inferior mammary fold.  Applying neosporin  Impression stage I left breast cancer status post breast conservation complicated by postop hematoma resolving  Plan: Radiation mastitis   Continue follow up with Rad Onc.. Followup in 6  Months.  If itching no better can try atarax.

## 2012-02-14 ENCOUNTER — Telehealth (INDEPENDENT_AMBULATORY_CARE_PROVIDER_SITE_OTHER): Payer: Self-pay | Admitting: General Surgery

## 2012-02-14 NOTE — Telephone Encounter (Signed)
Pt called in today stating that she is still itching around the radiation site, I went and talked to Dr Brantley Stage about the itching and see if he wanted to write a Rx for Atarax, the Rx for Atarax is not made any more, Dr Brantley Stage stated that she needs to call the Radiation Dr for the itching

## 2012-02-18 ENCOUNTER — Ambulatory Visit
Admission: RE | Admit: 2012-02-18 | Discharge: 2012-02-18 | Disposition: A | Discharge status: Home / Self Care | Payer: Medicare Other | Source: Ambulatory Visit | Attending: Radiation Oncology | Admitting: Radiation Oncology

## 2012-02-18 DIAGNOSIS — C50519 Malignant neoplasm of lower-outer quadrant of unspecified female breast: Secondary | ICD-10-CM

## 2012-02-18 NOTE — Progress Notes (Signed)
Followup note:  Laura Cruz visits today just over one week from completion of radiation therapy to her left breast in the management of her T1 N0 invasive ductal carcinoma. Following her treatment she apparently developed a moist desquamation along the inframammary region. She has been using Biafine cream . She has moderate discomfort. She does have oxycodone that she takes for her arthritis.  Physical examination: There is moist desquamation along the entire left inframammary fold. There is a whitish exudate. There is no obvious infection.  Impression: Moist desquamation secondary to radiation therapy. I reviewed her wound care with her, and she will use triple antibiotic ointment 3 times a day. She is to avoid wearing a bra.   Plan: Followup visit with Dr. Isidore Moos one week.

## 2012-02-26 ENCOUNTER — Other Ambulatory Visit: Payer: Self-pay | Admitting: Radiation Oncology

## 2012-02-26 ENCOUNTER — Encounter: Payer: Self-pay | Admitting: Radiation Oncology

## 2012-02-26 ENCOUNTER — Ambulatory Visit
Admission: RE | Admit: 2012-02-26 | Discharge: 2012-02-26 | Disposition: A | Discharge status: Home / Self Care | Payer: Medicare Other | Source: Ambulatory Visit | Attending: Radiation Oncology | Admitting: Radiation Oncology

## 2012-02-26 VITALS — BP 176/71 | HR 68 | Temp 97.9°F | Resp 18 | Wt 165.2 lb

## 2012-02-26 DIAGNOSIS — C50519 Malignant neoplasm of lower-outer quadrant of unspecified female breast: Secondary | ICD-10-CM

## 2012-02-26 DIAGNOSIS — B369 Superficial mycosis, unspecified: Secondary | ICD-10-CM

## 2012-02-26 MED ORDER — CLOTRIMAZOLE 1 % EX CREA: TOPICAL_CREAM | Freq: Two times a day (BID) | CUTANEOUS | Status: AC

## 2012-02-26 NOTE — Progress Notes (Signed)
Patient returns for followup today. She saw Dr. Valere Dross last week for moist desquamation at the inframammary fold. She continues to put Neosporin here. She puts Biafine over the rest of her breast and axilla. She says her left breast is still a little warm to touch but it has not changed much at all over the past week. She does note a little more redness at the inframammary fold  On exam, she has moist desquamation through the majority of the inframammary fold. There are islands of erythema throughout the moist desquamation suspicious for fungal infection. Of note, the patient does have diabetes.  I prescribed Clotrimazole cream for the patient to use twice a day in place of the neosporin. I will see her back in about 2 weeks. I instructed her to call if She has a temperature over 100 or if her breast  Looks / feels worse. She is pleased with this plan.

## 2012-02-26 NOTE — Progress Notes (Signed)
Received patient in the clinic today for a follow up skin check with Dr. Isidore Moos. Patient is alert and oriented to person, place, and time. No distress noted. Steady gait noted. Pleasant affect noted. Patient reports constant left breast pain 4 on a scale of 0-10. Patient reports that the last dose of Norco 5/325 mg she took was on Sunday. Patient reports that left breast continues to feel warm to the touch. Patient denies nipple discharge but, reports that the left nipple is very dry. Hyperpigmentation of left breast noted. Dry desquamation under left axilla noted. Large area of moist desquamation under left breast noted. Patient reports using Biafine and Neosporin as directed. Patient reports that she is out of Biafine and Radiaplex this morning. Reported all findings to Dr. Isidore Moos.

## 2012-03-13 ENCOUNTER — Ambulatory Visit
Admission: RE | Admit: 2012-03-13 | Discharge: 2012-03-13 | Disposition: A | Discharge status: Home / Self Care | Payer: Medicare Other | Source: Ambulatory Visit | Attending: Radiation Oncology | Admitting: Radiation Oncology

## 2012-03-13 ENCOUNTER — Ambulatory Visit: Payer: Medicare Other | Admitting: Radiation Oncology

## 2012-03-13 ENCOUNTER — Encounter: Payer: Self-pay | Admitting: Radiation Oncology

## 2012-03-13 VITALS — BP 151/71 | HR 71 | Temp 97.8°F | Wt 165.0 lb

## 2012-03-13 DIAGNOSIS — C50519 Malignant neoplasm of lower-outer quadrant of unspecified female breast: Secondary | ICD-10-CM

## 2012-03-13 NOTE — Progress Notes (Signed)
FU visit today for reassessment of left breast following radiation therapy.  States that she continues to have occassional shooting pain in the incisional region of left breast.  Redness noted in the superior portion of left breast inclusive of the areola.  The areola region with a Peau D'orange appearance.  The skin in the inframmary fold is healed with mild hyperpigmentation.

## 2012-03-13 NOTE — Progress Notes (Signed)
Radiation Oncology         [phone]) [phone] ________________________________  Name: Laura Cruz MRN: [id number]  Date: 03/13/2012  DOB: [birth year]  Follow-Up Visit Note  CC: [name], MD  [name], MD  Diagnosis:   T1 BN 0 M0 ER/PR positive grade 1 left breast cancer invasive ductal carcinoma  Interval Since Last Radiation: On 02/10/2012 she completed 50 Gray in 25 fractions to the left breast  Narrative:  The patient returns today for routine follow-up.  She is doing much better. She used the clotrimazole cream for the past 2 and half weeks and this seemed to help. There is no residual desquamation over her breast. She is in good spirits. She has opted not to take any antiestrogen therapy.                        ALLERGIES:  is allergic to morphine and related and cephalexin.  Meds: Current Outpatient Prescriptions  Medication Sig Dispense Refill  . amLODipine (NORVASC) 2.5 MG tablet Take 2.5 mg by mouth daily.        Marland Kitchen aspirin 325 MG tablet Take 325 mg by mouth daily.        Marland Kitchen atenolol (TENORMIN) 50 MG tablet Take 50 mg by mouth daily.        Marland Kitchen atorvastatin (LIPITOR) 10 MG tablet Take 10 mg by mouth daily.        Marland Kitchen buPROPion (WELLBUTRIN XL) 300 MG 24 hr tablet Take 300 mg by mouth daily.        Marland Kitchen HYDROcodone-acetaminophen (NORCO) 5-325 MG per tablet TAKE 1 TABLET BY MOUTH EVERY 6 HOURS AS NEEDED FOR PAIN  30 tablet  0  . levothyroxine (SYNTHROID, LEVOTHROID) 125 MCG tablet Take 125 mcg by mouth daily.        Marland Kitchen losartan-hydrochlorothiazide (HYZAAR) 50-12.5 MG per tablet Daily.      . nitroGLYCERIN (NITROSTAT) 0.4 MG SL tablet Place 0.4 mg under the tongue every 5 (five) minutes as needed.        Marland Kitchen omeprazole (PRILOSEC) 20 MG capsule Take 20 mg by mouth daily.        . sertraline (ZOLOFT) 100 MG tablet Take 100 mg by mouth daily.        . traZODone (DESYREL) 100 MG tablet Take 100 mg by mouth at bedtime.       . clotrimazole (LOTRIMIN) 1 % cream Apply topically 2 (two)  times daily. Apply to fold under left breast twice a day.  60 g  3  . OXYCONTIN 10 MG 12 hr tablet         Physical Findings: The patient is in no acute distress. Patient is alert and oriented.  weight is 165 lb (74.844 kg). Her temperature is 97.8 F (36.6 C). Her blood pressure is 151/71 and her pulse is 71.  No sign of residual infection over her breast. The moist and dry desquamation have resolved. She has residual hyperpigmentation over her breast.   Lab Findings: Lab Results  Component Value Date   WBC 6.7 11/18/2011   HGB 11.2* 11/18/2011   HCT 33.3* 11/18/2011   MCV 83.5 11/18/2011   PLT 127* 11/18/2011    Radiographic Findings: No results found.  Impression:  The patient is recovering from the effects of radiation.    Plan:  I will see her back in 6 months. I have encouraged her to call if she has any issues or concerns in the future. I  wished her the very best. _____________________________________   Eppie Gibson, MD

## 2012-03-27 ENCOUNTER — Ambulatory Visit: Payer: Medicare Other | Admitting: Radiation Oncology

## 2012-07-07 ENCOUNTER — Telehealth (INDEPENDENT_AMBULATORY_CARE_PROVIDER_SITE_OTHER): Payer: Self-pay

## 2012-07-07 NOTE — Telephone Encounter (Signed)
I called patient and moved her appt up to Monday 1/27 @ 2:50.

## 2012-07-07 NOTE — Telephone Encounter (Signed)
Message copied by Carlene Coria on Tue Jul 07, 2012  2:19 PM ------      Message from: Mamie Nick A      Created: Tue Jul 07, 2012 11:35 AM       Hi,      This patient's daughter called me to let us know that her mother has crusting and redness around her nipple.   Has a pink ring.         I asked if there is a lump or a bump or pain.   She stated it is tender.   I asked if they have applied anything- and she stated they have not.    I suggested she try the moisturizing lotion given to her by Radiation.              Is this a patient that maybe could come in early for a follow up to be examined?  She is scheduled in 2/14 anyway.            THANKS      Tami             Dtr. Ann's number [phone]

## 2012-07-13 ENCOUNTER — Ambulatory Visit (INDEPENDENT_AMBULATORY_CARE_PROVIDER_SITE_OTHER): Payer: Medicare Other | Admitting: Surgery

## 2012-07-13 ENCOUNTER — Encounter (INDEPENDENT_AMBULATORY_CARE_PROVIDER_SITE_OTHER): Payer: Self-pay | Admitting: Surgery

## 2012-07-13 VITALS — BP 138/64 | HR 80 | Resp 16 | Ht 65.0 in | Wt 266.8 lb

## 2012-07-13 DIAGNOSIS — Z853 Personal history of malignant neoplasm of breast: Secondary | ICD-10-CM

## 2012-07-13 HISTORY — PX: OTHER SURGICAL HISTORY: SHX169

## 2012-07-13 NOTE — Patient Instructions (Signed)
Call Thursday for results.  Can apply triple antibiotic cream or radiation cream as needed.

## 2012-07-13 NOTE — Progress Notes (Signed)
Patient ID: Laura Cruz, female   DOB: [birth year], 72 y.o.   MRN: [id number]  Chief Complaint  Patient presents with  . Breast Cancer Long Term Follow Up    HPI Laura Cruz is a 72 y.o. female.  Patient presents at the request of the breast cancer Center do to nipple changes. She is almost 8 months out from a left partial mastectomy with postop radiation. She has significant changes around the periphery of her nipple. She had severe postradiation mastitis which is fine resolve. The area is sore and painful. The breast is not red. HPI  Past Medical History  Diagnosis Date  . Hypertension   . Hypercholesteremia   . Arthritis   . Anxiety   . Depression   . Gastroesophageal reflux disease   . Hypothyroidism   . GERD (gastroesophageal reflux disease)   . Osteoarthritis   . Mass     Left ear, benign  . PONV (postoperative nausea and vomiting)   . Angina   . H/O hiatal hernia   . Diabetes mellitus     diet controlled  . Cancer     lt Breast    Past Surgical History  Procedure Date  . Bilateral knee arthroscopy 2000, June 2007    Replacements  . Tonsillectomy   . Partial hysterectomy age 80    Heavt Menses  . Hemorrhoid surgery   . Back surgery     2, pinched nerve  . US echocardiography 02-27-2009    Est EF 55-60%  . Cardiovascular stress test 02-24-2009    EF 68%  . Joint replacement   . Tonsillectomy   . Breast lumpectomy 11/13/11    Invasive Ductal Carcinoma. No lymphovascular Invasion; Margins Clear, 0/2  Nodes Negatie    Family History  Problem Relation Age of Onset  . Diabetes Mother   . Hypertension Father   . Stroke Father   . Prostate cancer Father   . Coronary artery disease Father   . Hypertension Sister   . Hypertension Brother   . Hypertension Brother   . Lung cancer Sister     Social History History  Substance Use Topics  . Smoking status: Never Smoker   . Smokeless tobacco: Never Used  . Alcohol Use: No    Allergies  Allergen Reactions   . Morphine And Related Other (See Comments)    "Makes me crazy."  . Cephalexin Rash    Current Outpatient Prescriptions  Medication Sig Dispense Refill  . amLODipine (NORVASC) 2.5 MG tablet Take 2.5 mg by mouth daily.        Marland Kitchen aspirin 325 MG tablet Take 325 mg by mouth daily.        Marland Kitchen atenolol (TENORMIN) 50 MG tablet Take 50 mg by mouth daily.        Marland Kitchen atorvastatin (LIPITOR) 10 MG tablet Take 10 mg by mouth daily.        Marland Kitchen buPROPion (WELLBUTRIN XL) 300 MG 24 hr tablet Take 300 mg by mouth daily.        . clotrimazole (LOTRIMIN) 1 % cream Apply topically 2 (two) times daily. Apply to fold under left breast twice a day.  60 g  3  . HYDROcodone-acetaminophen (NORCO) 5-325 MG per tablet TAKE 1 TABLET BY MOUTH EVERY 6 HOURS AS NEEDED FOR PAIN  30 tablet  0  . levothyroxine (SYNTHROID, LEVOTHROID) 125 MCG tablet Take 125 mcg by mouth daily.        Marland Kitchen losartan-hydrochlorothiazide (HYZAAR) 50-12.5  MG per tablet Daily.      . nitroGLYCERIN (NITROSTAT) 0.4 MG SL tablet Place 0.4 mg under the tongue every 5 (five) minutes as needed.        Marland Kitchen omeprazole (PRILOSEC) 20 MG capsule Take 20 mg by mouth daily.        . OXYCONTIN 10 MG 12 hr tablet       . sertraline (ZOLOFT) 100 MG tablet Take 100 mg by mouth daily.        . traZODone (DESYREL) 100 MG tablet Take 100 mg by mouth at bedtime.         Review of Systems Review of Systems  Constitutional: Negative.   HENT: Negative.   Respiratory: Negative.     Blood pressure 138/64, pulse 80, resp. rate 16, height 5\' 5"  (1.651 m), weight 266 lb 12.8 oz (121.02 kg).  Physical Exam Physical Exam  Constitutional: She appears well-developed and well-nourished.  HENT:  Head: Normocephalic and atraumatic.  Eyes: EOM are normal. Pupils are equal, round, and reactive to light.  Pulmonary/Chest: Right breast exhibits no inverted nipple, no mass, no nipple discharge, no skin change and no tenderness. Left breast exhibits skin change. Left breast exhibits no  inverted nipple, no mass and no tenderness. Breasts are symmetrical.        Assessment    Stage I left breast cancer history of from 2013 status post left breast lumpectomy and postop radiation therapy with severe mastitis with changes to nipple areolar complex punch biopsy done in office today to exclude Paget's disease.    Plan    Await on results.  Return in 3 months if benign       Laura Cruz A. 07/13/2012, 3:23 PM

## 2012-07-15 ENCOUNTER — Telehealth (INDEPENDENT_AMBULATORY_CARE_PROVIDER_SITE_OTHER): Payer: Self-pay

## 2012-07-15 NOTE — Telephone Encounter (Signed)
Called pt with path results. Told her to use hydrocortisone cream on nipple.

## 2012-08-10 ENCOUNTER — Ambulatory Visit (INDEPENDENT_AMBULATORY_CARE_PROVIDER_SITE_OTHER): Payer: Medicare Other | Admitting: Surgery

## 2012-09-09 ENCOUNTER — Encounter: Payer: Self-pay | Admitting: Radiation Oncology

## 2012-09-11 ENCOUNTER — Ambulatory Visit
Admission: RE | Admit: 2012-09-11 | Discharge: 2012-09-11 | Disposition: A | Discharge status: Home / Self Care | Payer: Medicare Other | Source: Ambulatory Visit | Attending: Radiation Oncology | Admitting: Radiation Oncology

## 2012-09-11 ENCOUNTER — Encounter: Payer: Self-pay | Admitting: Radiation Oncology

## 2012-09-11 VITALS — BP 165/69 | HR 74 | Temp 97.9°F | Wt 166.7 lb

## 2012-09-11 DIAGNOSIS — L500 Allergic urticaria: Secondary | ICD-10-CM

## 2012-09-11 DIAGNOSIS — C50512 Malignant neoplasm of lower-outer quadrant of left female breast: Secondary | ICD-10-CM

## 2012-09-11 DIAGNOSIS — C50919 Malignant neoplasm of unspecified site of unspecified female breast: Secondary | ICD-10-CM | POA: Insufficient documentation

## 2012-09-11 DIAGNOSIS — Z170 Estrogen receptor positive status [ER+]: Secondary | ICD-10-CM | POA: Insufficient documentation

## 2012-09-11 DIAGNOSIS — Z79899 Other long term (current) drug therapy: Secondary | ICD-10-CM | POA: Insufficient documentation

## 2012-09-11 HISTORY — DX: Personal history of irradiation: Z92.3

## 2012-09-11 HISTORY — DX: Malignant neoplasm of unspecified site of unspecified female breast: C50.919

## 2012-09-11 MED ORDER — RADIAPLEXRX EX GEL
Freq: Once | CUTANEOUS | Status: AC
  Administered 2012-09-11: 10:00:00 via TOPICAL

## 2012-09-11 NOTE — Progress Notes (Signed)
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 She saw Dr. Brantley Stage in January for skin irritation over the left breast around the nipple. He recommended hydrocortisone but she has not been using this. She has not recently seen a dermatologist. The irritation over her left breast has expanded, gotten worse. She also has a smaller patch of irritation over the right breast that is similar.  She puts a lot of downy fabric softener on her laundry but cannot think of any other factors that may contribute to this irritation.  Dr. Brantley Stage did perform a punch biopsy of the skin lesion rule out recurrence, it demonstrated:  Diagnosis Skin , left breast nipple, punch biopsy URTICARIAL ALLERGIC REACTION WITH FOCAL SPONGIOSIS. Microscopic Description The epidermis is slightly acanthotic with minimal spongiosis. The dermis has a mixed infiltrate of neutrophils, rare eosinophils and mononuclear cells, some of which appear crushed. There is pigment within macrophages within the dermis and some of this has a golden brown pigment, but I cannot distinguish whether this may be hemosiderin or whether it would be melanin. I have taken additional levels where the pigment is more prominently visualized. At the edge of the biopsy a very small focus of interface change is noted. The histology suggests an inflammatory dermatosis. There is crush artifact limiting the cytologic evaluation. With the pigment and the mixture of inflammatory cells this correlates best with an  urticarial allergic reaction. This could be related to a drug reaction and a resolving localized hypersensitivity reaction such as an insect bite reaction could also result in this pattern. There is some nuclear dust and an old vasculitic process could not be excluded. I do not see fibrinoid necrosis of vessel walls or thrombi and the deep dermis does not appear to be altered. I have performed a PAS stain and do not see hyphal structures in the stratum corneum. Further clinical correlation is suggested in this setting to determine the significance of the inflammatory reaction pattern. I do not see evidence of malignancy. (BJS:caf 07/15/12)                        ALLERGIES:  is allergic to morphine and related and cephalexin.  Meds: Current Outpatient Prescriptions  Medication Sig Dispense Refill  . amLODipine (NORVASC) 2.5 MG tablet Take 2.5 mg by mouth daily.        Marland Kitchen atorvastatin (LIPITOR) 10 MG tablet Take 10 mg by mouth daily.        Marland Kitchen buPROPion (WELLBUTRIN XL) 300 MG 24 hr tablet Take 300 mg by mouth daily.        Marland Kitchen levothyroxine (SYNTHROID, LEVOTHROID) 125 MCG tablet Take 125 mcg by mouth daily.        Marland Kitchen losartan-hydrochlorothiazide (HYZAAR) 50-12.5 MG per tablet Daily.      . nitroGLYCERIN (NITROSTAT) 0.4 MG SL tablet Place 0.4 mg under the tongue every 5 (five) minutes as needed.        Marland Kitchen omeprazole (PRILOSEC) 20 MG capsule Take 20 mg by mouth daily.        Marland Kitchen  OXYCONTIN 10 MG 12 hr tablet 5 mg.       . sertraline (ZOLOFT) 100 MG tablet Take 100 mg by mouth daily.        . traZODone (DESYREL) 100 MG tablet Take 100 mg by mouth at bedtime.       Marland Kitchen aspirin 325 MG tablet Take 325 mg by mouth daily.        Marland Kitchen atenolol (TENORMIN) 50 MG tablet Take 50 mg by mouth daily.        . clotrimazole (LOTRIMIN) 1 % cream Apply topically 2 (two) times daily. Apply to fold under left breast twice a day.  60 g  3  . HYDROcodone-acetaminophen (NORCO) 5-325 MG per tablet TAKE 1 TABLET BY MOUTH EVERY 6  HOURS AS NEEDED FOR PAIN  30 tablet  0   No current facility-administered medications for this encounter.    Physical Findings: The patient is in no acute distress. Patient is alert and oriented.  weight is 166 lb 11.2 oz (75.615 kg). Her temperature is 97.9 F (36.6 C). Her blood pressure is 165/69 and her pulse is 74. .   Left breast demonstrates a scaly, dry, expansive rash, spanning around the nipple circumferentially. It measures at least 10 cm in greatest dimension. Borders of the entire lesion are scalloped. The skin surrounding the nipple is bluish and then the skin more distal to the nipple is erythematous. There is a smaller patch of scaly erythematous skin at the most superior aspect of the left breast, measuring about 1-1/2 cm. Left breast demonstrates a 2-3 cm lesion, in the upper inner quadrant, only a centimeter from the nipple, this is erythematous and scaly, with scalloped borders, similar in nature to the much larger lesion over the left breast. Of note, the inframammary fold of the left breast, where the patient had developed significant moist desquamation during radiotherapy, demonstrates some residual erythema, but does not have this scaly rash.   Lab Findings: Lab Results  Component Value Date   WBC 6.7 11/18/2011   HGB 11.2* 11/18/2011   HCT 33.3* 11/18/2011   MCV 83.5 11/18/2011   PLT 127* 11/18/2011      Radiographic Findings: No results found.  Impression/Plan: This is a lovely 72 year old woman with a history of post lumpectomy radiation to the left breast for stage I breast cancer. She developed a significant amount of moist desquamation and possible fungal infection at her left inframammary fold during treatment. Clotrimazole cream seemed to help. About 7 months later, she now presents with an unusual skin rash over her breasts. The larger rash is over the left breast but she does have a significant patch of the right breast as well, which was not exposed radiotherapy.  Left breast biopsy revealedURTICARIAL ALLERGIC REACTION WITH FOCAL SPONGIOSIS.  I think this needs to be evaluated by dermatologist. She is already established with Dr. Nevada Crane. I recommended that the family called Dr. Nevada Crane to schedule an appointment with him as soon as possible. In the meantime, I recommended 2% hydrocortisone cream and radiaplex gel over these lesions. I will see her back in 6 months for followup. I will order her annual mammogram to be done with [facility] radiology, do around the end of April.  The patient and family were encouraged to call if  she has any issues in the interim.    I spent 20 minutes minutes face to face with the patient and more than 50% of that time was spent in counseling and/or coordination of  care. _____________________________________   Eppie Gibson, MD

## 2012-09-11 NOTE — Addendum Note (Signed)
Encounter addended by: Deirdre Evener, RN on: 09/11/2012  1:20 PM<BR>     Documentation filed: Charges VN

## 2012-09-11 NOTE — Addendum Note (Signed)
Encounter addended by: Deirdre Evener, RN on: 09/11/2012  6:17 PM<BR>     Documentation filed: Charges VN

## 2012-09-11 NOTE — Progress Notes (Signed)
Laura Cruz is here accompained by her family for follow up after 25 fractions to her left breast.  She denies pain today but states that she does have pain that varies in strength in her left breast.  The skin on her left breast is reddened and she has yellow/green scabbed areas all around her breast.  She states that it hurts and is itchy.  She also has a red scattered rash under her left breast.  She was applying Radiaplex gel to the area but ran out.  She also has fatigue.

## 2012-09-14 ENCOUNTER — Telehealth: Payer: Self-pay | Admitting: *Deleted

## 2012-09-14 NOTE — Telephone Encounter (Signed)
CALLED PATIENT TO INFORM OF APPT. WITH DR. Allyn Kenner ON April 4- ARRIVAL TIME - 1:30 PM, LVM FOR A RETURN CALL

## 2012-10-09 ENCOUNTER — Ambulatory Visit
Admission: RE | Admit: 2012-10-09 | Discharge: 2012-10-09 | Disposition: A | Discharge status: Home / Self Care | Payer: Medicare Other | Source: Ambulatory Visit | Attending: Radiation Oncology | Admitting: Radiation Oncology

## 2012-10-09 DIAGNOSIS — C50512 Malignant neoplasm of lower-outer quadrant of left female breast: Secondary | ICD-10-CM

## 2012-10-12 ENCOUNTER — Ambulatory Visit (INDEPENDENT_AMBULATORY_CARE_PROVIDER_SITE_OTHER): Payer: Medicare Other | Admitting: Surgery

## 2012-10-12 ENCOUNTER — Encounter (INDEPENDENT_AMBULATORY_CARE_PROVIDER_SITE_OTHER): Payer: Self-pay | Admitting: Surgery

## 2012-10-12 VITALS — BP 130/70 | HR 64 | Resp 16 | Ht 64.0 in | Wt 164.6 lb

## 2012-10-12 DIAGNOSIS — Z853 Personal history of malignant neoplasm of breast: Secondary | ICD-10-CM

## 2012-10-12 NOTE — Progress Notes (Signed)
Patient ID: Laura Cruz, female   DOB: 22-Mar-1941, 72 y.o.   MRN: [id number]  No chief complaint on file.   HPI Laura Cruz is a 72 y.o. female.  Patient presents at the request of the breast cancer Center do to nipple changes. She is almost 8 months out from a left partial mastectomy with postop radiation. She has significant changes around the periphery of her nipple. She had severe postradiation mastitis which iresolve. The area was  sore and painful. Nipple biopsy negative  For Paget's Disease. HPI  Past Medical History  Diagnosis Date  . Hypertension   . Hypercholesteremia   . Arthritis   . Anxiety   . Depression   . Gastroesophageal reflux disease   . Hypothyroidism   . GERD (gastroesophageal reflux disease)   . Osteoarthritis   . Mass     Left ear, benign  . PONV (postoperative nausea and vomiting)   . Angina   . H/O hiatal hernia   . Diabetes mellitus     diet controlled  . Breast cancer MAY 2013    LEFT BREAST  . S/P radiation therapy 01/07/12 - 02/10/12    LEFT BREAST / 50 Gy / 25 FRACTIONS    Past Surgical History  Procedure Laterality Date  . Bilateral knee arthroscopy  2000, June 2007    Replacements  . Tonsillectomy    . Partial hysterectomy  age 74    Heavt Menses  . Hemorrhoid surgery    . Back surgery      2, pinched nerve  . US echocardiography  02-27-2009    Est EF 55-60%  . Cardiovascular stress test  02-24-2009    EF 68%  . Joint replacement    . Tonsillectomy    . Breast lumpectomy  11/13/11    Invasive Ductal Carcinoma. No lymphovascular Invasion; Margins Clear, 0/2  Nodes Negatie  . Left breast nipple punch biopsy Left 07/13/12    URTICARIAL ALLERGIC REACTION WITH FOCAL SPONGIOSIS    Family History  Problem Relation Age of Onset  . Diabetes Mother   . Hypertension Father   . Stroke Father   . Prostate cancer Father   . Coronary artery disease Father   . Hypertension Sister   . Hypertension Brother   . Hypertension Brother   . Lung  cancer Sister     Social History History  Substance Use Topics  . Smoking status: Never Smoker   . Smokeless tobacco: Never Used  . Alcohol Use: No    Allergies  Allergen Reactions  . Morphine And Related Other (See Comments)    "Makes me crazy."  . Cephalexin Rash    Current Outpatient Prescriptions  Medication Sig Dispense Refill  . amLODipine (NORVASC) 2.5 MG tablet Take 2.5 mg by mouth daily.        Marland Kitchen aspirin 325 MG tablet Take 325 mg by mouth daily.        Marland Kitchen atenolol (TENORMIN) 50 MG tablet Take 50 mg by mouth daily.        Marland Kitchen atorvastatin (LIPITOR) 10 MG tablet Take 10 mg by mouth daily.        Marland Kitchen buPROPion (WELLBUTRIN XL) 300 MG 24 hr tablet Take 300 mg by mouth daily.        . clotrimazole (LOTRIMIN) 1 % cream Apply topically 2 (two) times daily. Apply to fold under left breast twice a day.  60 g  3  . HYDROcodone-acetaminophen (NORCO) 5-325 MG per tablet TAKE  1 TABLET BY MOUTH EVERY 6 HOURS AS NEEDED FOR PAIN  30 tablet  0  . levothyroxine (SYNTHROID, LEVOTHROID) 125 MCG tablet Take 125 mcg by mouth daily.        Marland Kitchen losartan-hydrochlorothiazide (HYZAAR) 50-12.5 MG per tablet Daily.      . nitroGLYCERIN (NITROSTAT) 0.4 MG SL tablet Place 0.4 mg under the tongue every 5 (five) minutes as needed.        Marland Kitchen omeprazole (PRILOSEC) 20 MG capsule Take 20 mg by mouth daily.        . OXYCONTIN 10 MG 12 hr tablet 5 mg.       . sertraline (ZOLOFT) 100 MG tablet Take 100 mg by mouth daily.        . traZODone (DESYREL) 100 MG tablet Take 100 mg by mouth at bedtime.        No current facility-administered medications for this visit.    Review of Systems Review of Systems  Constitutional: Negative.   HENT: Negative.   Respiratory: Negative.     Blood pressure 130/70, pulse 64, resp. rate 16, height 5\' 4"  (1.626 m), weight 164 lb 9.6 oz (74.662 kg).  Physical Exam Physical Exam  Constitutional: She appears well-developed and well-nourished.  HENT:  Head: Normocephalic and  atraumatic.  Eyes: EOM are normal. Pupils are equal, round, and reactive to light.  Pulmonary/Chest: Right breast exhibits no inverted nipple, no mass, no nipple discharge, no skin change and no tenderness. Left breast exhibits skin change. Left breast exhibits no inverted nipple, no mass and no tenderness. Breasts are symmetrical.   Left nipple much improved. Min inflammation noted  No discharge or mass.     Assessment    Stage I left breast cancer history of from 2013 status post left breast lumpectomy and postop radiation therapy with severe mastitis with changes to nipple areolar complex punch biopsy done showing only inflammation improved with steroid preparation    Plan     Return 1 year.       Treyvin Glidden A. 10/12/2012, 2:34 PM

## 2012-10-12 NOTE — Patient Instructions (Signed)
Return 1 year. 

## 2012-12-30 ENCOUNTER — Other Ambulatory Visit (HOSPITAL_COMMUNITY): Payer: Medicare Other

## 2013-01-05 ENCOUNTER — Ambulatory Visit: Admit: 2013-01-05 | Payer: Self-pay | Admitting: Ophthalmology

## 2013-01-05 SURGERY — PHACOEMULSIFICATION, CATARACT, WITH IOL INSERTION
Anesthesia: Monitor Anesthesia Care | Laterality: Left

## 2013-01-26 ENCOUNTER — Ambulatory Visit: Admit: 2013-01-26 | Payer: Self-pay | Admitting: Ophthalmology

## 2013-01-26 SURGERY — PHACOEMULSIFICATION, CATARACT, WITH IOL INSERTION
Anesthesia: Monitor Anesthesia Care | Laterality: Right

## 2013-03-12 ENCOUNTER — Telehealth: Payer: Self-pay | Admitting: *Deleted

## 2013-03-12 ENCOUNTER — Ambulatory Visit
Admission: RE | Admit: 2013-03-12 | Discharge: 2013-03-12 | Disposition: A | Discharge status: Home / Self Care | Payer: Medicare Other | Source: Ambulatory Visit | Attending: Radiation Oncology | Admitting: Radiation Oncology

## 2013-03-12 ENCOUNTER — Encounter: Payer: Self-pay | Admitting: Radiation Oncology

## 2013-03-12 VITALS — BP 153/66 | HR 68 | Temp 97.8°F | Ht 64.0 in | Wt 163.9 lb

## 2013-03-12 DIAGNOSIS — C50512 Malignant neoplasm of lower-outer quadrant of left female breast: Secondary | ICD-10-CM

## 2013-03-12 NOTE — Progress Notes (Signed)
Radiation Oncology         [phone] ________________________________  Name: Laura Cruz MRN: [id number]  Date: 03/12/2013  DOB: 09-Feb-1941  Follow-Up Visit Note  outpatient  CC: [name], MD  Cornett, Joyice Faster., MD  Diagnosis and Prior Radiotherapy:   Pathologic T1b N0 M0 ER/PR positive grade 1 left breast cancer, invasive ductal carcinoma  Interval Since Last Radiation: She completed 50 Gray in 25 fractions 02/10/12 to the left breast   Narrative:  The patient returns today for routine follow-up.  Skin irritation over the breast has resolved. She took a cream from Dr. Nevada Crane but is not sure what it was.  Has some residual hyperpigmentation around L nipple.  Otherwise, fatigued, but doing well.                             ALLERGIES:  is allergic to morphine and related and cephalexin.  Meds: Current Outpatient Prescriptions  Medication Sig Dispense Refill  . amLODipine (NORVASC) 2.5 MG tablet Take 2.5 mg by mouth daily.        Marland Kitchen aspirin 81 MG tablet Take 81 mg by mouth daily.      Marland Kitchen atenolol (TENORMIN) 50 MG tablet Take 50 mg by mouth daily.        Marland Kitchen atorvastatin (LIPITOR) 10 MG tablet Take 10 mg by mouth daily.        Marland Kitchen buPROPion (WELLBUTRIN XL) 300 MG 24 hr tablet Take 300 mg by mouth daily.        Marland Kitchen levothyroxine (SYNTHROID, LEVOTHROID) 125 MCG tablet Take 125 mcg by mouth daily.        Marland Kitchen losartan-hydrochlorothiazide (HYZAAR) 50-12.5 MG per tablet Daily.      . nitroGLYCERIN (NITROSTAT) 0.4 MG SL tablet Place 0.4 mg under the tongue every 5 (five) minutes as needed.        Marland Kitchen omeprazole (PRILOSEC) 20 MG capsule Take 20 mg by mouth daily.        . OXYCONTIN 10 MG 12 hr tablet 5 mg.       . sertraline (ZOLOFT) 100 MG tablet Take 100 mg by mouth daily.        . traZODone (DESYREL) 100 MG tablet Take 100 mg by mouth at bedtime.        No current facility-administered medications for this encounter.    Physical Findings: The patient is in no acute distress. Patient is  alert and oriented.  height is 5\' 4"  (1.626 m) and weight is 163 lb 14.4 oz (74.345 kg). Her temperature is 97.8 F (36.6 C). Her blood pressure is 153/66 and her pulse is 68. Marland Kitchen    Left breast - residual hyperpigmentation of L areola. Telangiectasias of left inframammary fold.   No palpable breast lesions or axillary adenopathy bilaterally.   Lab Findings: Lab Results  Component Value Date   WBC 6.7 11/18/2011   HGB 11.2* 11/18/2011   HCT 33.3* 11/18/2011   MCV 83.5 11/18/2011   PLT 127* 11/18/2011    Radiographic Findings: No results found.  Impression/Plan:  Doing well, NED.  Will see her back in 1 year.  She is continuing to see Dr. Brantley Stage annually as well.  I will order F/u mamogram for April.   I have encouraged her to call if she has any issues or concerns in the interim.  I spent 15 minutes face to face with the patient and more than 50% of that time was  spent in counseling and/or coordination of care. _____________________________________   Eppie Gibson, MD

## 2013-03-12 NOTE — Progress Notes (Signed)
Ms. Knape here for fu s/p radiation to her left breat which completed in Aug. 2013.  She reports intermittent shooting pains in her left breast.  She reports fatigue " all the time".  By mid day "she has had it"

## 2013-03-12 NOTE — Telephone Encounter (Signed)
Called patient to inform of test on 09-29-13- arrival time- 7:45 am at [facility], lvm for a return call

## 2013-07-16 IMAGING — CR DG CHEST 2V
2 series · 2 of 2 positions shown · non-contrast
Comparison: 04/14/2006

CLINICAL DATA: Nonproductive cough.  Diabetes and hypertension.
Preop respiratory exam for left breast surgery.

CHEST - 2 VIEW

[w chest pa]
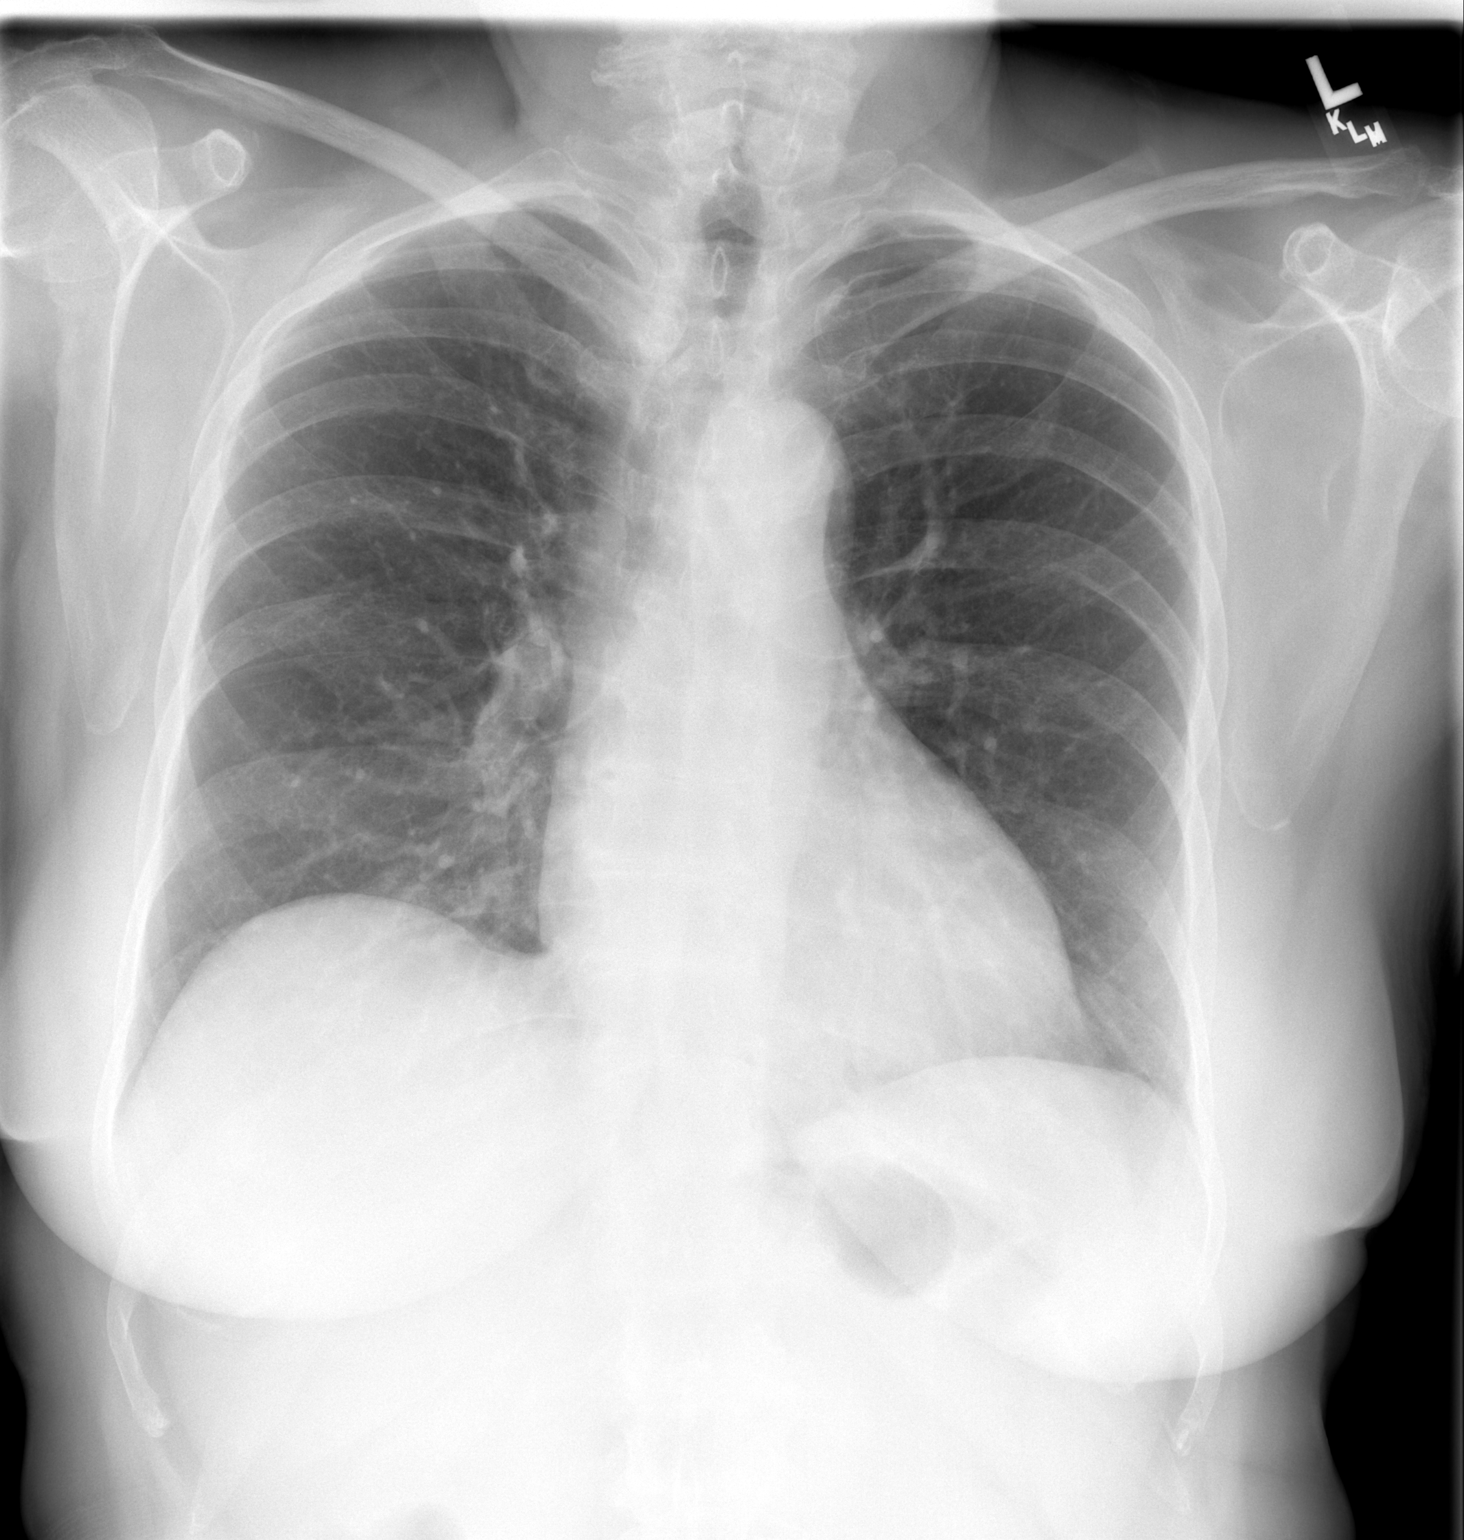

[w chest lat]
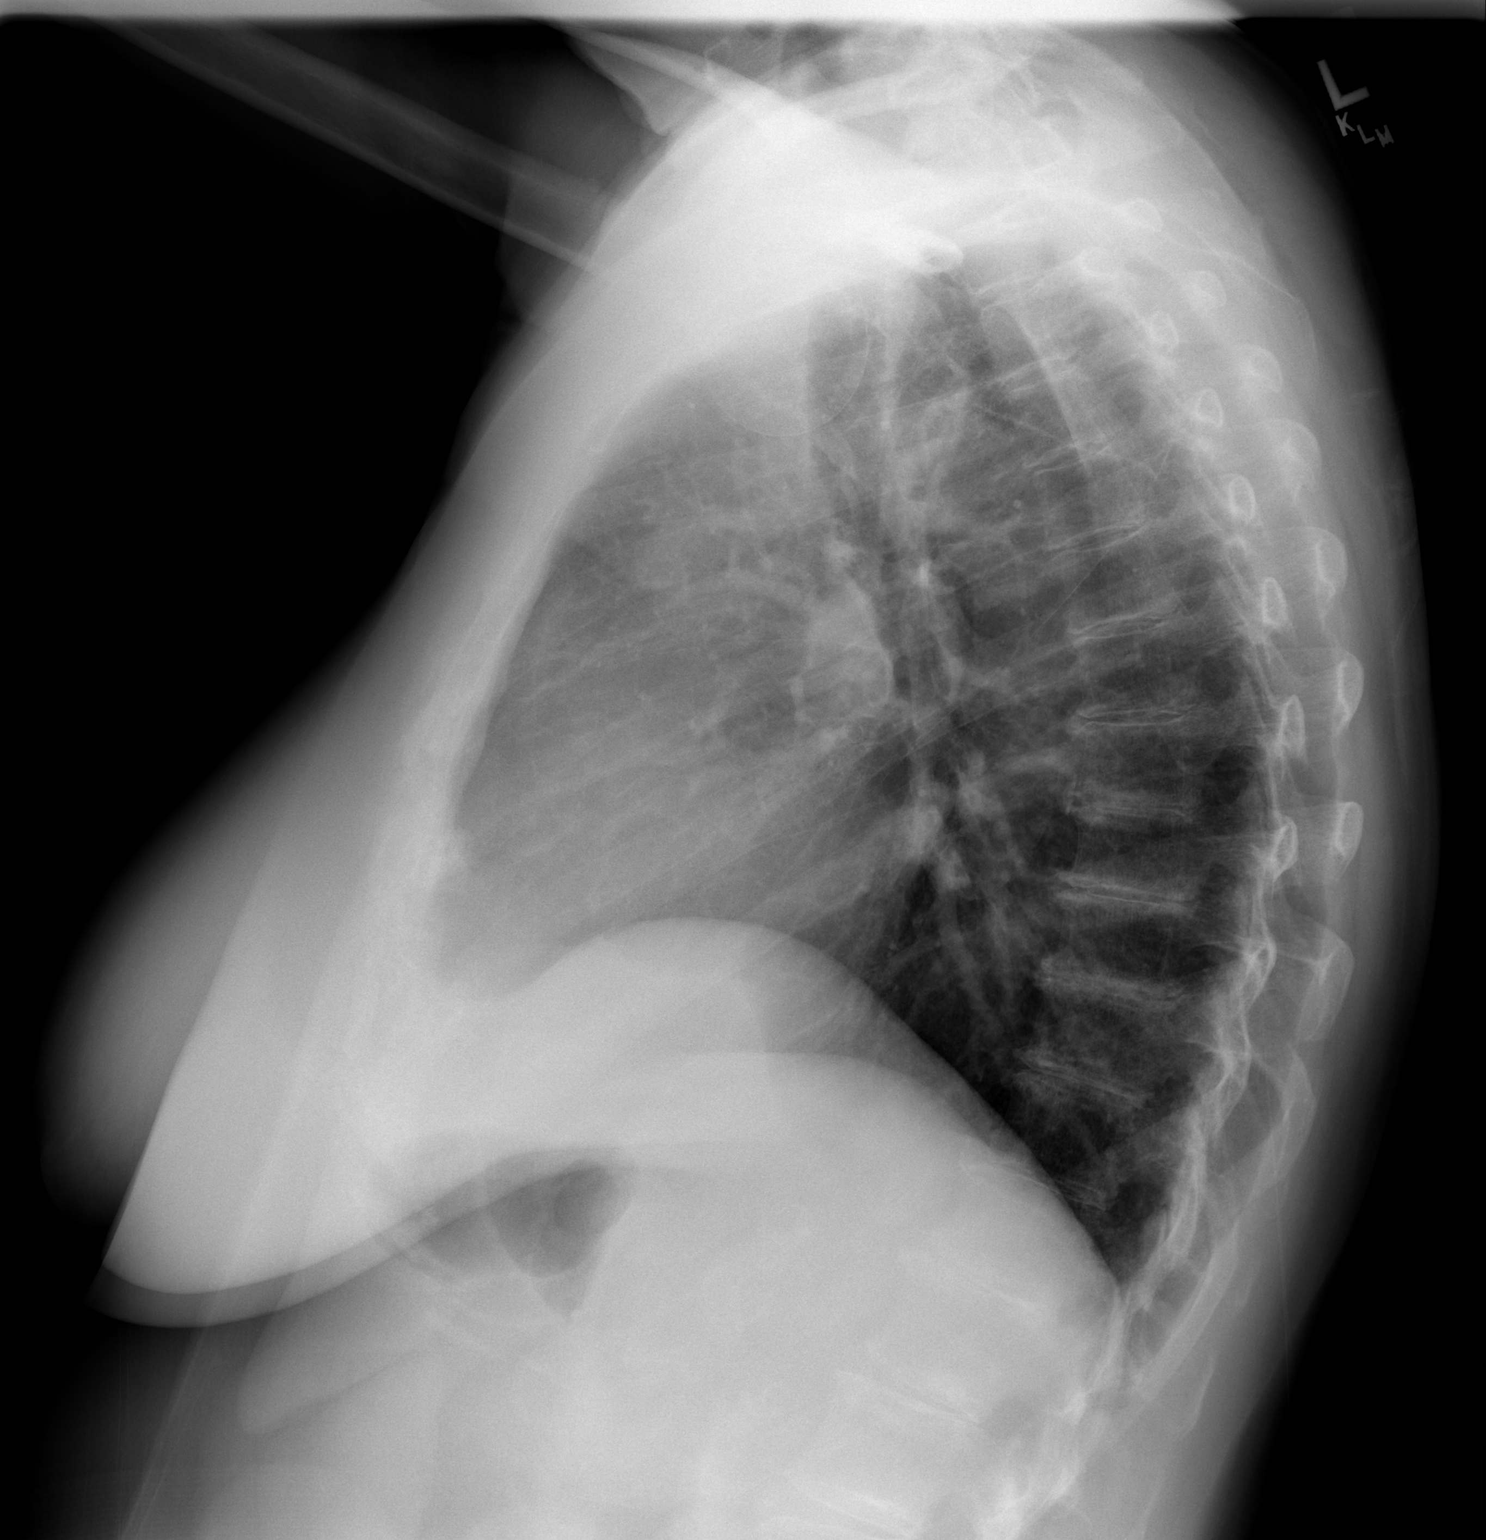

[2 of 2 positions shown; findings below may reference images not displayed]

FINDINGS: The heart size and mediastinal contours are within
normal limits.  Both lungs are clear.  Midlung lower thoracic spine
degenerative disc disease shows no significant interval change.
IMPRESSION: No active cardiopulmonary disease.

## 2013-09-27 ENCOUNTER — Ambulatory Visit (INDEPENDENT_AMBULATORY_CARE_PROVIDER_SITE_OTHER): Payer: Medicare Other | Admitting: Surgery

## 2013-09-27 ENCOUNTER — Encounter (INDEPENDENT_AMBULATORY_CARE_PROVIDER_SITE_OTHER): Payer: Self-pay | Admitting: Surgery

## 2013-09-27 VITALS — BP 162/70 | HR 74 | Temp 97.9°F | Resp 16 | Ht 64.0 in | Wt 159.0 lb

## 2013-09-27 DIAGNOSIS — Z853 Personal history of malignant neoplasm of breast: Secondary | ICD-10-CM

## 2013-09-27 NOTE — Patient Instructions (Signed)
Return 1 year. 

## 2013-09-27 NOTE — Progress Notes (Signed)
Patient ID: Laura Cruz, female   DOB: 1941/01/04, 73 y.o.   MRN: [id number]  Chief Complaint  Patient presents with  . Breast Cancer Long Term Follow Up    LTFU/yearly br rechk/mammo    HPI Laura Cruz is a 73 y.o. female.  Patient presents at the request of the breast cancer Center do to nipple changes. She is almost 8 months out from a left partial mastectomy with postop radiation. She has significant changes around the periphery of her nipple. She had severe postradiation mastitis which  Has resolve. The area was  sore and painful. Nipple biopsy negative  For Paget's Disease. She feels tired.  HPI  Past Medical History  Diagnosis Date  . Hypertension   . Hypercholesteremia   . Arthritis   . Anxiety   . Depression   . Gastroesophageal reflux disease   . Hypothyroidism   . GERD (gastroesophageal reflux disease)   . Osteoarthritis   . Mass     Left ear, benign  . PONV (postoperative nausea and vomiting)   . Angina   . H/O hiatal hernia   . Diabetes mellitus     diet controlled  . Breast cancer MAY 2013    LEFT BREAST  . S/P radiation therapy 01/07/12 - 02/10/12    LEFT BREAST / 50 Gy / 25 FRACTIONS    Past Surgical History  Procedure Laterality Date  . Bilateral knee arthroscopy  2000, June 2007    Replacements  . Tonsillectomy    . Partial hysterectomy  age 63    Heavt Menses  . Hemorrhoid surgery    . Back surgery      2, pinched nerve  . US echocardiography  02-27-2009    Est EF 55-60%  . Cardiovascular stress test  02-24-2009    EF 68%  . Joint replacement    . Tonsillectomy    . Breast lumpectomy  11/13/11    Invasive Ductal Carcinoma. No lymphovascular Invasion; Margins Clear, 0/2  Nodes Negatie  . Left breast nipple punch biopsy Left 07/13/12    URTICARIAL ALLERGIC REACTION WITH FOCAL SPONGIOSIS    Family History  Problem Relation Age of Onset  . Diabetes Mother   . Hypertension Father   . Stroke Father   . Prostate cancer Father   . Coronary artery  disease Father   . Hypertension Sister   . Hypertension Brother   . Hypertension Brother   . Lung cancer Sister     Social History History  Substance Use Topics  . Smoking status: Never Smoker   . Smokeless tobacco: Never Used  . Alcohol Use: No    Allergies  Allergen Reactions  . Morphine And Related Other (See Comments)    "Makes me crazy."  . Cephalexin Rash    Current Outpatient Prescriptions  Medication Sig Dispense Refill  . amLODipine (NORVASC) 2.5 MG tablet Take 2.5 mg by mouth daily.        Marland Kitchen aspirin 81 MG tablet Take 81 mg by mouth daily.      Marland Kitchen atenolol (TENORMIN) 50 MG tablet Take 50 mg by mouth daily.        Marland Kitchen atorvastatin (LIPITOR) 10 MG tablet Take 10 mg by mouth daily.        Marland Kitchen buPROPion (WELLBUTRIN XL) 300 MG 24 hr tablet Take 300 mg by mouth daily.        . fluticasone (CUTIVATE) 0.05 % cream       . levothyroxine (SYNTHROID,  LEVOTHROID) 125 MCG tablet Take 125 mcg by mouth daily.        Marland Kitchen losartan-hydrochlorothiazide (HYZAAR) 50-12.5 MG per tablet Daily.      . nitroGLYCERIN (NITROSTAT) 0.4 MG SL tablet Place 0.4 mg under the tongue every 5 (five) minutes as needed.        Marland Kitchen omeprazole (PRILOSEC) 20 MG capsule Take 20 mg by mouth daily.        . OXYCONTIN 10 MG 12 hr tablet 5 mg.       . pravastatin (PRAVACHOL) 20 MG tablet       . sertraline (ZOLOFT) 100 MG tablet Take 100 mg by mouth daily.        . traZODone (DESYREL) 100 MG tablet Take 100 mg by mouth at bedtime.       . triamcinolone (KENALOG) 0.025 % cream        No current facility-administered medications for this visit.    Review of Systems Review of Systems  Constitutional: Negative.   HENT: Negative.   Respiratory: Negative.     Blood pressure 162/70, pulse 74, temperature 97.9 F (36.6 C), temperature source Temporal, resp. rate 16, height 5\' 4"  (1.626 m), weight 159 lb (72.122 kg).  Physical Exam Physical Exam  Constitutional: She appears well-developed and well-nourished.  HENT:   Head: Normocephalic and atraumatic.  Eyes: EOM are normal. Pupils are equal, round, and reactive to light.  Pulmonary/Chest: Right breast exhibits no inverted nipple, no mass, no nipple discharge, no skin change and no tenderness. Left breast exhibits skin change. Left breast exhibits no inverted nipple, no mass and no tenderness. Breasts are symmetrical.   Left nipple much improved. No  inflammation noted  No discharge or mass.   Mammogram this month pending    Assessment    Stage I left breast cancer history of from 2013 status post left breast lumpectomy and postop radiation therapy   Nipple much better  Plan     Return 1 year.       Arbadella Kimbler A. Smriti Barkow 09/27/2013, 12:21 PM

## 2014-01-13 ENCOUNTER — Encounter: Payer: Self-pay | Admitting: Cardiovascular Disease

## 2014-02-18 ENCOUNTER — Ambulatory Visit: Payer: Medicare Other | Admitting: Radiation Oncology

## 2014-03-04 ENCOUNTER — Ambulatory Visit: Payer: Medicare Other | Admitting: Radiation Oncology

## 2014-03-08 ENCOUNTER — Encounter: Payer: Self-pay | Admitting: Radiation Oncology

## 2014-03-08 ENCOUNTER — Other Ambulatory Visit: Payer: Self-pay | Admitting: Radiation Oncology

## 2014-03-08 ENCOUNTER — Telehealth: Payer: Self-pay | Admitting: *Deleted

## 2014-03-08 DIAGNOSIS — C50512 Malignant neoplasm of lower-outer quadrant of left female breast: Secondary | ICD-10-CM

## 2014-03-08 NOTE — Telephone Encounter (Signed)
CALLED PATIENT TO INFORM OF MAMMOGRAM AND FU VISIT, SPOKE WITH PATIENT AND SHE IS AWARE OF THESE DATES AND TIMES

## 2014-03-09 ENCOUNTER — Inpatient Hospital Stay
Admission: RE | Admit: 2014-03-09 | Discharge: 2014-03-09 | Disposition: A | Discharge status: Home / Self Care | Payer: Medicare Other | Source: Ambulatory Visit | Attending: Radiation Oncology | Admitting: Radiation Oncology

## 2014-03-14 ENCOUNTER — Ambulatory Visit
Admission: RE | Admit: 2014-03-14 | Discharge: 2014-03-14 | Disposition: A | Discharge status: Home / Self Care | Payer: Medicare Other | Source: Ambulatory Visit | Attending: Radiation Oncology | Admitting: Radiation Oncology

## 2014-03-14 ENCOUNTER — Encounter (INDEPENDENT_AMBULATORY_CARE_PROVIDER_SITE_OTHER): Payer: Self-pay

## 2014-03-14 DIAGNOSIS — C50512 Malignant neoplasm of lower-outer quadrant of left female breast: Secondary | ICD-10-CM

## 2014-03-25 ENCOUNTER — Ambulatory Visit
Admission: RE | Admit: 2014-03-25 | Discharge: 2014-03-25 | Disposition: A | Discharge status: Home / Self Care | Payer: Medicare Other | Source: Ambulatory Visit | Attending: Radiation Oncology | Admitting: Radiation Oncology

## 2014-03-25 ENCOUNTER — Encounter: Payer: Self-pay | Admitting: Radiation Oncology

## 2014-03-25 ENCOUNTER — Other Ambulatory Visit: Payer: Self-pay | Admitting: Radiation Oncology

## 2014-03-25 VITALS — BP 142/67 | HR 68 | Temp 97.8°F | Resp 20 | Ht 64.0 in | Wt 161.7 lb

## 2014-03-25 DIAGNOSIS — C50912 Malignant neoplasm of unspecified site of left female breast: Secondary | ICD-10-CM

## 2014-03-25 DIAGNOSIS — C50512 Malignant neoplasm of lower-outer quadrant of left female breast: Secondary | ICD-10-CM

## 2014-03-25 NOTE — Progress Notes (Signed)
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                               BI-RADS CATEGORY 2 mammogram on 03-14-14. No new complaints.                              ALLERGIES:  is allergic to morphine and related and cephalexin.  Meds: Current Outpatient Prescriptions  Medication Sig Dispense Refill  . amLODipine (NORVASC) 2.5 MG tablet Take 2.5 mg by mouth daily.        Marland Kitchen aspirin 81 MG tablet Take 81 mg by mouth daily.      Marland Kitchen atenolol (TENORMIN) 50 MG tablet Take 50 mg by mouth daily.        Marland Kitchen atorvastatin (LIPITOR) 10 MG tablet Take 10 mg by mouth daily.        Marland Kitchen buPROPion (WELLBUTRIN XL) 300 MG 24 hr tablet Take 300 mg by mouth daily.        . fluticasone (CUTIVATE) 0.05 % cream       . levothyroxine (SYNTHROID, LEVOTHROID) 125 MCG tablet Take 125 mcg by mouth daily.        Marland Kitchen losartan-hydrochlorothiazide (HYZAAR) 50-12.5 MG per tablet Daily.      Marland Kitchen omeprazole (PRILOSEC) 20 MG capsule Take 20 mg by mouth daily.        . OXYCONTIN 10 MG 12 hr tablet 5 mg.       . pravastatin (PRAVACHOL) 20 MG tablet       . sertraline (ZOLOFT) 100 MG tablet Take 100 mg by mouth daily.        . traZODone (DESYREL) 100 MG tablet Take 100 mg by mouth at bedtime.       . triamcinolone (KENALOG) 0.025 % cream       . nitroGLYCERIN (NITROSTAT) 0.4 MG SL tablet Place 0.4 mg under the tongue every 5 (five) minutes as needed.         No current facility-administered medications for this encounter.    Physical Findings: The patient is in no  acute distress. Patient is alert and oriented.  height is 5\' 4"  (1.626 m) and weight is 161 lb 11.2 oz (73.347 kg). Her oral temperature is 97.8 F (36.6 C). Her blood pressure is 142/67 and her pulse is 68. Her respiration is 20. Marland Kitchen    NECK: No cervical or supraclavicular lymphadenopathy BREASTS: No lesions palpated in breasts or axillary regions bilaterally. Dryness/crusting over left breast areola with yeast-like erythematous rash at inframammary fold   Lab Findings: Lab Results  Component Value Date   WBC 6.7 11/18/2011   HGB 11.2* 11/18/2011   HCT 33.3* 11/18/2011   MCV 83.5 11/18/2011   PLT 127* 11/18/2011    Radiographic Findings: Mm Digital Diagnostic Bilat  03/14/2014   CLINICAL DATA:  Patient is a currently asymptomatic 73 year old female with a personal history of left  breast cancer status post conservation therapy including lumpectomy and radiation in 2013.  EXAM: DIGITAL DIAGNOSTIC  BILATERAL MAMMOGRAM WITH CAD  COMPARISON:  10/08/2011 through 10/09/2012 mammograms  ACR Breast Density Category b: There are scattered areas of fibroglandular density.  FINDINGS: Digital bilateral diagnostic mammography demonstrates scattered fibroglandular densities. Postsurgical scarring is again identified in the posterior upper outer left breast at site of lumpectomy. Minimal skin thickening of the left breast and left breast interstitium is again identified consistent with radiation sequelae. Spot-compression magnification views of the lumpectomy bed demonstrates stable postsurgical scarring and effacement of normal fibroglandular tissue. No new mass or suspicious microcalcification.  Mammographic images were processed with CAD.  IMPRESSION: Post lumpectomy and radiation changes of the left breast without mammographic findings of malignancy.  RECOMMENDATION: Diagnostic mammogram is suggested in 1 year. (Code:DM-B-01Y)  I have discussed the findings and recommendations with the patient. Results were also  provided in writing at the conclusion of the visit. If applicable, a reminder letter will be sent to the patient regarding the next appointment.  BI-RADS CATEGORY  2: Benign Finding(s)   Electronically Signed   By: Andres Shad   On: 03/14/2014 11:40    Impression/Plan:  NED  F/u with Dr. 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

## 2014-03-25 NOTE — Progress Notes (Signed)
Follow up s/p left breastr radiation completed 02/10/12, stated at times left breast still hurts, had her mammogram 03/14/14 ,results in, pain in right knee, chronic 11:34 AM

## 2014-03-28 ENCOUNTER — Telehealth: Payer: Self-pay | Admitting: *Deleted

## 2014-03-28 NOTE — Telephone Encounter (Signed)
Called patient to inform of mammogram and fu, lvm for a return call

## 2014-04-18 ENCOUNTER — Encounter: Payer: Self-pay | Admitting: Radiation Oncology

## 2015-03-24 ENCOUNTER — Ambulatory Visit: Payer: Medicare Other | Admitting: Radiation Oncology

## 2015-03-24 ENCOUNTER — Other Ambulatory Visit: Payer: Self-pay | Admitting: Radiation Oncology

## 2015-03-24 DIAGNOSIS — C50512 Malignant neoplasm of lower-outer quadrant of left female breast: Secondary | ICD-10-CM

## 2015-03-27 ENCOUNTER — Telehealth: Payer: Self-pay | Admitting: *Deleted

## 2015-03-27 NOTE — Telephone Encounter (Signed)
Called patient to inform of mammogram on 04-05-15 - arrival time - 1 pm @ [facility] and her fu visit with Dr. Isidore Moos on 04-07-15 @ 4 pm, spoke with patient and she is aware of these appts.

## 2015-03-31 ENCOUNTER — Ambulatory Visit: Payer: Medicare Other | Admitting: Radiation Oncology

## 2015-03-31 ENCOUNTER — Ambulatory Visit
Admission: RE | Admit: 2015-03-31 | Discharge: 2015-03-31 | Disposition: A | Discharge status: Home / Self Care | Payer: Medicare Other | Source: Ambulatory Visit | Attending: Radiation Oncology | Admitting: Radiation Oncology

## 2015-04-05 ENCOUNTER — Ambulatory Visit
Admission: RE | Admit: 2015-04-05 | Discharge: 2015-04-05 | Disposition: A | Discharge status: Home / Self Care | Payer: Medicare Other | Source: Ambulatory Visit | Attending: Radiation Oncology | Admitting: Radiation Oncology

## 2015-04-05 DIAGNOSIS — C50512 Malignant neoplasm of lower-outer quadrant of left female breast: Secondary | ICD-10-CM

## 2015-04-07 ENCOUNTER — Ambulatory Visit: Payer: Medicare Other | Admitting: Radiation Oncology

## 2015-04-18 NOTE — Progress Notes (Signed)
Radiation Oncology         [phone] ________________________________  Name: Laura Cruz MRN: [id number]  Date: 04/19/2015  DOB: 1940-08-25  Follow-Up Visit Note  Outpatient  CC: [name], MD  [name], MD    ICD-9-CM ICD-10-CM   1. 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                               She had a BI-RADS CATEGORY 2 mammogram on 04-05-15. Denies pain, lumps, or bumps in breasts.  She is not on anti-estrogen therapy. Doing well.                              ALLERGIES:  is allergic to morphine and related and cephalexin.  Meds: Current Outpatient Prescriptions  Medication Sig Dispense Refill  . amLODipine (NORVASC) 2.5 MG tablet Take 2.5 mg by mouth daily.      Marland Kitchen aspirin 81 MG tablet Take 81 mg by mouth daily.    Marland Kitchen atenolol (TENORMIN) 50 MG tablet Take 50 mg by mouth daily.      Marland Kitchen atorvastatin (LIPITOR) 10 MG tablet Take 10 mg by mouth daily.      Marland Kitchen buPROPion (WELLBUTRIN XL) 300 MG 24 hr tablet Take 300 mg by mouth daily.      . fluticasone (CUTIVATE) 0.05 % cream     . levothyroxine (SYNTHROID, LEVOTHROID) 125 MCG tablet Take 125 mcg by mouth daily.      Marland Kitchen losartan-hydrochlorothiazide (HYZAAR) 50-12.5 MG per tablet Daily.    . nitroGLYCERIN (NITROSTAT) 0.4 MG SL tablet Place 0.4 mg under the tongue every 5 (five) minutes as needed.      Marland Kitchen omeprazole (PRILOSEC) 20 MG capsule Take 20 mg by mouth daily.      . OXYCONTIN 10 MG 12 hr tablet 5 mg.     . pravastatin (PRAVACHOL) 20 MG tablet     . sertraline (ZOLOFT) 100 MG tablet Take 100 mg by mouth daily.      . traZODone (DESYREL) 100 MG tablet Take 100 mg by mouth at bedtime.     .  triamcinolone (KENALOG) 0.025 % cream      No current facility-administered medications for this encounter.    Physical Findings: The patient is in no acute distress. Patient is alert and oriented.  weight is 160 lb 14.4 oz (72.984 kg). Her blood pressure is 142/67 and her pulse is 66. Her respiration is 16 and oxygen saturation is 100%. Marland Kitchen    NECK: No cervical or supraclavicular lymphadenopathy BREASTS: No lesions palpated in breasts or axillary regions bilaterally. Hyperpigmentation, modest, around left breast areola with telangiectasias at inframammary fold on left.  Skin intact. Ext: no upper extremity edema   Lab Findings: Lab Results  Component Value Date   WBC 6.7 11/18/2011   HGB 11.2* 11/18/2011   HCT 33.3* 11/18/2011   MCV 83.5 11/18/2011   PLT 127* 11/18/2011    Radiographic Findings: As above   Impression/Plan:  NED  I encouraged her to continue with yearly mammography and  followup with our breast survivorship specialist in 1 year - Chestine Spore, NP. I will see her back on an as-needed basis. I have encouraged her to call if she has any issues or concerns in the future. I wished her the very best. She is pleased with this plan.   _____________________________________   Eppie Gibson, MD

## 2015-04-19 ENCOUNTER — Ambulatory Visit
Admission: RE | Admit: 2015-04-19 | Discharge: 2015-04-19 | Disposition: A | Discharge status: Home / Self Care | Payer: Medicare Other | Source: Ambulatory Visit | Attending: Radiation Oncology | Admitting: Radiation Oncology

## 2015-04-19 ENCOUNTER — Encounter: Payer: Self-pay | Admitting: Radiation Oncology

## 2015-04-19 VITALS — BP 142/67 | HR 66 | Resp 16 | Wt 160.9 lb

## 2015-04-19 DIAGNOSIS — C50512 Malignant neoplasm of lower-outer quadrant of left female breast: Secondary | ICD-10-CM

## 2015-04-19 NOTE — Progress Notes (Signed)
Patient returns today for yearly follow up with Dr. Isidore Moos. Weight and vitals stable. Denies pain. Denies palpating any lumps or bumps in either breast. Denies nipple discharge or breast pain. Denies fatigue. Understands her mammogram from earlier this year is normal. Denies taking any anti-estrogen therapy. Reports using a lotion provided to her by Dr. Nevada Crane, dermatologist.

## 2015-04-21 ENCOUNTER — Telehealth: Payer: Self-pay | Admitting: *Deleted

## 2015-04-21 NOTE — Telephone Encounter (Signed)
CALLED PATIENT TO INFORM OF SURVIVORSHIP APPT. ON 04-18-16 @ 1:30 PM WITH HEATHER MACKEY, LVM FOR A RETURN CALL

## 2015-07-19 DIAGNOSIS — E876 Hypokalemia: Secondary | ICD-10-CM | POA: Diagnosis not present

## 2015-10-09 DIAGNOSIS — Z853 Personal history of malignant neoplasm of breast: Secondary | ICD-10-CM | POA: Diagnosis not present

## 2015-10-09 DIAGNOSIS — Z6827 Body mass index (BMI) 27.0-27.9, adult: Secondary | ICD-10-CM | POA: Diagnosis not present

## 2015-10-09 DIAGNOSIS — Z01419 Encounter for gynecological examination (general) (routine) without abnormal findings: Secondary | ICD-10-CM | POA: Diagnosis not present

## 2015-11-03 DIAGNOSIS — H6122 Impacted cerumen, left ear: Secondary | ICD-10-CM | POA: Diagnosis not present

## 2015-11-30 DIAGNOSIS — M542 Cervicalgia: Secondary | ICD-10-CM | POA: Diagnosis not present

## 2015-11-30 DIAGNOSIS — M5090 Cervical disc disorder, unspecified, unspecified cervical region: Secondary | ICD-10-CM | POA: Diagnosis not present

## 2016-01-10 DIAGNOSIS — M5090 Cervical disc disorder, unspecified, unspecified cervical region: Secondary | ICD-10-CM | POA: Diagnosis not present

## 2016-03-05 DIAGNOSIS — E782 Mixed hyperlipidemia: Secondary | ICD-10-CM | POA: Diagnosis not present

## 2016-03-05 DIAGNOSIS — M4800 Spinal stenosis, site unspecified: Secondary | ICD-10-CM | POA: Diagnosis not present

## 2016-03-05 DIAGNOSIS — Z0000 Encounter for general adult medical examination without abnormal findings: Secondary | ICD-10-CM | POA: Diagnosis not present

## 2016-03-05 DIAGNOSIS — E1122 Type 2 diabetes mellitus with diabetic chronic kidney disease: Secondary | ICD-10-CM | POA: Diagnosis not present

## 2016-03-05 DIAGNOSIS — M150 Primary generalized (osteo)arthritis: Secondary | ICD-10-CM | POA: Diagnosis not present

## 2016-03-05 DIAGNOSIS — I129 Hypertensive chronic kidney disease with stage 1 through stage 4 chronic kidney disease, or unspecified chronic kidney disease: Secondary | ICD-10-CM | POA: Diagnosis not present

## 2016-03-05 DIAGNOSIS — E039 Hypothyroidism, unspecified: Secondary | ICD-10-CM | POA: Diagnosis not present

## 2016-03-05 DIAGNOSIS — F322 Major depressive disorder, single episode, severe without psychotic features: Secondary | ICD-10-CM | POA: Diagnosis not present

## 2016-03-05 DIAGNOSIS — D692 Other nonthrombocytopenic purpura: Secondary | ICD-10-CM | POA: Diagnosis not present

## 2016-03-05 DIAGNOSIS — N183 Chronic kidney disease, stage 3 (moderate): Secondary | ICD-10-CM | POA: Diagnosis not present

## 2016-03-11 ENCOUNTER — Other Ambulatory Visit: Payer: Self-pay | Admitting: Radiation Oncology

## 2016-03-11 DIAGNOSIS — Z1231 Encounter for screening mammogram for malignant neoplasm of breast: Secondary | ICD-10-CM

## 2016-03-12 DIAGNOSIS — N179 Acute kidney failure, unspecified: Secondary | ICD-10-CM | POA: Diagnosis not present

## 2016-03-20 DIAGNOSIS — E876 Hypokalemia: Secondary | ICD-10-CM | POA: Diagnosis not present

## 2016-03-27 DIAGNOSIS — E876 Hypokalemia: Secondary | ICD-10-CM | POA: Diagnosis not present

## 2016-04-05 ENCOUNTER — Ambulatory Visit
Admission: RE | Admit: 2016-04-05 | Discharge: 2016-04-05 | Disposition: A | Discharge status: Home / Self Care | Payer: Medicare Other | Source: Ambulatory Visit | Attending: Radiation Oncology | Admitting: Radiation Oncology

## 2016-04-05 DIAGNOSIS — R928 Other abnormal and inconclusive findings on diagnostic imaging of breast: Secondary | ICD-10-CM | POA: Diagnosis not present

## 2016-04-05 DIAGNOSIS — Z1231 Encounter for screening mammogram for malignant neoplasm of breast: Secondary | ICD-10-CM

## 2016-04-10 DIAGNOSIS — E876 Hypokalemia: Secondary | ICD-10-CM | POA: Diagnosis not present

## 2016-04-18 ENCOUNTER — Encounter: Payer: Medicare Other | Admitting: Nurse Practitioner

## 2016-04-18 ENCOUNTER — Encounter: Payer: Medicare Other | Admitting: Adult Health

## 2016-05-03 ENCOUNTER — Encounter: Payer: Medicare Other | Admitting: Adult Health

## 2016-08-08 DIAGNOSIS — H6522 Chronic serous otitis media, left ear: Secondary | ICD-10-CM | POA: Diagnosis not present

## 2016-08-08 DIAGNOSIS — H6122 Impacted cerumen, left ear: Secondary | ICD-10-CM | POA: Diagnosis not present

## 2016-09-09 DIAGNOSIS — F322 Major depressive disorder, single episode, severe without psychotic features: Secondary | ICD-10-CM | POA: Diagnosis not present

## 2016-09-09 DIAGNOSIS — I129 Hypertensive chronic kidney disease with stage 1 through stage 4 chronic kidney disease, or unspecified chronic kidney disease: Secondary | ICD-10-CM | POA: Diagnosis not present

## 2016-09-09 DIAGNOSIS — N183 Chronic kidney disease, stage 3 (moderate): Secondary | ICD-10-CM | POA: Diagnosis not present

## 2016-09-09 DIAGNOSIS — E1122 Type 2 diabetes mellitus with diabetic chronic kidney disease: Secondary | ICD-10-CM | POA: Diagnosis not present

## 2016-10-01 DIAGNOSIS — H524 Presbyopia: Secondary | ICD-10-CM | POA: Diagnosis not present

## 2016-10-01 DIAGNOSIS — Z961 Presence of intraocular lens: Secondary | ICD-10-CM | POA: Diagnosis not present

## 2016-10-01 DIAGNOSIS — H5211 Myopia, right eye: Secondary | ICD-10-CM | POA: Diagnosis not present

## 2016-10-01 DIAGNOSIS — E119 Type 2 diabetes mellitus without complications: Secondary | ICD-10-CM | POA: Diagnosis not present

## 2017-03-20 ENCOUNTER — Other Ambulatory Visit: Payer: Self-pay | Admitting: Family Medicine

## 2017-03-20 ENCOUNTER — Ambulatory Visit
Admission: RE | Admit: 2017-03-20 | Discharge: 2017-03-20 | Disposition: A | Discharge status: Home / Self Care | Payer: Medicare Other | Source: Ambulatory Visit | Attending: Family Medicine | Admitting: Family Medicine

## 2017-03-20 DIAGNOSIS — N183 Chronic kidney disease, stage 3 (moderate): Secondary | ICD-10-CM | POA: Diagnosis not present

## 2017-03-20 DIAGNOSIS — E1122 Type 2 diabetes mellitus with diabetic chronic kidney disease: Secondary | ICD-10-CM | POA: Diagnosis not present

## 2017-03-20 DIAGNOSIS — R059 Cough, unspecified: Secondary | ICD-10-CM

## 2017-03-20 DIAGNOSIS — R05 Cough: Secondary | ICD-10-CM | POA: Diagnosis not present

## 2017-03-20 DIAGNOSIS — Z0000 Encounter for general adult medical examination without abnormal findings: Secondary | ICD-10-CM | POA: Diagnosis not present

## 2017-03-20 DIAGNOSIS — I129 Hypertensive chronic kidney disease with stage 1 through stage 4 chronic kidney disease, or unspecified chronic kidney disease: Secondary | ICD-10-CM | POA: Diagnosis not present

## 2017-04-09 ENCOUNTER — Other Ambulatory Visit: Payer: Self-pay | Admitting: Family Medicine

## 2017-04-09 DIAGNOSIS — Z853 Personal history of malignant neoplasm of breast: Secondary | ICD-10-CM

## 2017-04-16 ENCOUNTER — Ambulatory Visit
Admission: RE | Admit: 2017-04-16 | Discharge: 2017-04-16 | Disposition: A | Discharge status: Home / Self Care | Payer: Medicare Other | Source: Ambulatory Visit | Attending: Family Medicine | Admitting: Family Medicine

## 2017-04-16 DIAGNOSIS — R928 Other abnormal and inconclusive findings on diagnostic imaging of breast: Secondary | ICD-10-CM | POA: Diagnosis not present

## 2017-04-16 DIAGNOSIS — Z853 Personal history of malignant neoplasm of breast: Secondary | ICD-10-CM

## 2017-04-17 DIAGNOSIS — L258 Unspecified contact dermatitis due to other agents: Secondary | ICD-10-CM | POA: Diagnosis not present

## 2017-05-19 DIAGNOSIS — E039 Hypothyroidism, unspecified: Secondary | ICD-10-CM | POA: Diagnosis not present

## 2017-08-01 DIAGNOSIS — I959 Hypotension, unspecified: Secondary | ICD-10-CM | POA: Diagnosis not present

## 2017-08-01 DIAGNOSIS — R5383 Other fatigue: Secondary | ICD-10-CM | POA: Diagnosis not present

## 2017-08-14 ENCOUNTER — Other Ambulatory Visit: Payer: Self-pay | Admitting: Family Medicine

## 2017-08-14 DIAGNOSIS — R42 Dizziness and giddiness: Secondary | ICD-10-CM

## 2017-08-14 DIAGNOSIS — R5383 Other fatigue: Secondary | ICD-10-CM

## 2017-08-14 DIAGNOSIS — R0602 Shortness of breath: Secondary | ICD-10-CM

## 2017-08-21 ENCOUNTER — Ambulatory Visit (HOSPITAL_COMMUNITY): Payer: Medicare Other | Attending: Cardiology

## 2017-08-21 ENCOUNTER — Other Ambulatory Visit: Payer: Self-pay

## 2017-08-21 DIAGNOSIS — Z853 Personal history of malignant neoplasm of breast: Secondary | ICD-10-CM | POA: Diagnosis not present

## 2017-08-21 DIAGNOSIS — I081 Rheumatic disorders of both mitral and tricuspid valves: Secondary | ICD-10-CM | POA: Insufficient documentation

## 2017-08-21 DIAGNOSIS — R42 Dizziness and giddiness: Secondary | ICD-10-CM | POA: Diagnosis not present

## 2017-08-21 DIAGNOSIS — R5383 Other fatigue: Secondary | ICD-10-CM

## 2017-08-21 DIAGNOSIS — R0602 Shortness of breath: Secondary | ICD-10-CM | POA: Insufficient documentation

## 2017-08-22 DIAGNOSIS — I129 Hypertensive chronic kidney disease with stage 1 through stage 4 chronic kidney disease, or unspecified chronic kidney disease: Secondary | ICD-10-CM | POA: Diagnosis not present

## 2017-08-22 DIAGNOSIS — I422 Other hypertrophic cardiomyopathy: Secondary | ICD-10-CM | POA: Diagnosis not present

## 2017-08-27 DIAGNOSIS — I422 Other hypertrophic cardiomyopathy: Secondary | ICD-10-CM | POA: Diagnosis not present

## 2017-09-24 ENCOUNTER — Ambulatory Visit: Payer: Self-pay | Admitting: Podiatry

## 2017-10-01 ENCOUNTER — Encounter: Payer: Self-pay | Admitting: Cardiovascular Disease

## 2017-10-13 ENCOUNTER — Ambulatory Visit: Payer: Medicare Other | Admitting: Cardiovascular Disease

## 2017-10-13 ENCOUNTER — Encounter: Payer: Self-pay | Admitting: Nurse Practitioner

## 2017-10-13 ENCOUNTER — Encounter: Payer: Self-pay | Admitting: Cardiovascular Disease

## 2017-10-13 VITALS — BP 122/60 | HR 67 | Ht 64.0 in | Wt 154.8 lb

## 2017-10-13 DIAGNOSIS — R0600 Dyspnea, unspecified: Secondary | ICD-10-CM

## 2017-10-13 DIAGNOSIS — R0609 Other forms of dyspnea: Secondary | ICD-10-CM | POA: Diagnosis not present

## 2017-10-13 DIAGNOSIS — I517 Cardiomegaly: Secondary | ICD-10-CM | POA: Diagnosis not present

## 2017-10-13 DIAGNOSIS — I10 Essential (primary) hypertension: Secondary | ICD-10-CM | POA: Diagnosis not present

## 2017-10-13 NOTE — Progress Notes (Signed)
Cardiology Office Note:    Date:  10/13/2017   ID:  Laura Cruz, DOB 11-16-1940, MRN [id number]  PCP:  Laura Neer, MD  Cardiologist:  Laura Moores, MD   Referring MD: Laura Neer, MD   Chief Complaint  Patient presents with  . Hypertension     October 13, 2017:    Laura Cruz is a 76 y.o. female with a hx of hypertension, diabetes, left  breast cancer  hyperlipidemia and chronic kidney disease. Seen with Laura Cruz ( daughter )   We are asked to see her today at the request of Laura Cruz for further evaluation of severe left ventricular hypertrophy and the possibility of amyloidosis.  Laura Cruz has already done blood and urine eval for amyloydosis.   SPEP and UPEP are normal  Last saw Laura Cruz 7 years ago.   Lots of fatigue and DOE  No CP   Still eats lots of salt,   Had lots of discussion about salt intake     Past Medical History:  Diagnosis Date  . Angina   . Anxiety   . Arthritis   . Breast cancer ([place]) MAY 2013   LEFT BREAST  . Depression   . Diabetes mellitus    diet controlled  . Gastroesophageal reflux disease   . GERD (gastroesophageal reflux disease)   . H/O hiatal hernia   . Hypercholesteremia   . Hypertension   . Hypothyroidism   . Mass    Left ear, benign  . Osteoarthritis   . PONV (postoperative nausea and vomiting)   . S/P radiation therapy 01/07/12 - 02/10/12   LEFT BREAST / 50 Gy / 25 FRACTIONS    Past Surgical History:  Procedure Laterality Date  . BACK SURGERY     2, pinched nerve  . BILATERAL KNEE ARTHROSCOPY  2000, June 2007   Replacements  . BREAST LUMPECTOMY  11/13/11   Invasive Ductal Carcinoma. No lymphovascular Invasion; Margins Clear, 0/2  Nodes Negatie  . CARDIOVASCULAR STRESS TEST  02-24-2009   EF 68%  . HEMORRHOID SURGERY    . JOINT REPLACEMENT    . LEFT BREAST NIPPLE PUNCH BIOPSY Left 07/13/12   URTICARIAL ALLERGIC REACTION WITH FOCAL SPONGIOSIS  . PARTIAL HYSTERECTOMY  age 63   Heavt Menses  . TONSILLECTOMY      . TONSILLECTOMY    . US ECHOCARDIOGRAPHY  02-27-2009   Est EF 55-60%    Current Medications: Current Meds  Medication Sig  . amLODipine (NORVASC) 2.5 MG tablet Take 2.5 mg by mouth daily.    Marland Kitchen aspirin 81 MG tablet Take 81 mg by mouth daily.  Marland Kitchen atenolol (TENORMIN) 50 MG tablet Take 50 mg by mouth daily.    Marland Kitchen atorvastatin (LIPITOR) 10 MG tablet Take 10 mg by mouth daily.    Marland Kitchen buPROPion (WELLBUTRIN XL) 300 MG 24 hr tablet Take 300 mg by mouth daily.    . fluticasone (CUTIVATE) 0.05 % cream   . levothyroxine (SYNTHROID, LEVOTHROID) 125 MCG tablet Take 125 mcg by mouth daily.    Marland Kitchen losartan-hydrochlorothiazide (HYZAAR) 50-12.5 MG per tablet Daily.  . nitroGLYCERIN (NITROSTAT) 0.4 MG SL tablet Place 0.4 mg under the tongue every 5 (five) minutes as needed.    Marland Kitchen omeprazole (PRILOSEC) 20 MG capsule Take 20 mg by mouth daily.    . OXYCONTIN 10 MG 12 hr tablet 5 mg.   . pravastatin (PRAVACHOL) 20 MG tablet   . sertraline (ZOLOFT) 100 MG tablet Take 100 mg by mouth  daily.    . traZODone (DESYREL) 100 MG tablet Take 100 mg by mouth at bedtime.   . triamcinolone (KENALOG) 0.025 % cream      Allergies:   Morphine and related and Cephalexin   Social History   Socioeconomic History  . Marital status: Married    Spouse name: Not on file  . Number of children: Not on file  . Years of education: Not on file  . Highest education level: Not on file  Occupational History  . Not on file  Social Needs  . Financial resource strain: Not on file  . Food insecurity:    Worry: Not on file    Inability: Not on file  . Transportation needs:    Medical: Not on file    Non-medical: Not on file  Tobacco Use  . Smoking status: Never Smoker  . Smokeless tobacco: Never Used  Substance and Sexual Activity  . Alcohol use: No  . Drug use: No  . Sexual activity: Not Currently  Lifestyle  . Physical activity:    Days per week: Not on file    Minutes per session: Not on file  . Stress: Not on file   Relationships  . Social connections:    Talks on phone: Not on file    Gets together: Not on file    Attends religious service: Not on file    Active member of club or organization: Not on file    Attends meetings of clubs or organizations: Not on file    Relationship status: Not on file  Other Topics Concern  . Not on file  Social History Narrative  . Not on file     Family History: The patient's family history includes Coronary artery disease in her father; Diabetes in her mother; Hypertension in her brother, brother, father, and sister; Lung cancer in her sister; Prostate cancer in her father; Stroke in her father.  ROS:   Please see the history of present illness.     All other systems reviewed and are negative.   Physical Exam:    VS:  BP 122/60   Cruz 66   Ht 5\' 4"  (1.626 m)   Wt 154 lb 12.8 oz (70.2 kg)   SpO2 96%   BMI 26.57 kg/m     Wt Readings from Last 3 Encounters:  10/13/17 154 lb 12.8 oz (70.2 kg)  04/19/15 160 lb 14.4 oz (73 kg)  03/25/14 161 lb 11.2 oz (73.3 kg)     GEN:   Elderly female, acute distress HEENT: Normal NECK: No JVD; No carotid bruits LYMPHATICS: No lymphadenopathy CARDIAC: RRR, no murmurs, rubs, gallops RESPIRATORY:  Clear to auscultation without rales, wheezing or rhonchi  ABDOMEN: Soft, non-tender, non-distended MUSCULOSKELETAL:  No edema; No deformity  SKIN: Warm and dry NEUROLOGIC:  Alert and oriented x 3 PSYCHIATRIC:  Normal affect   EKGs/Labs/Other Studies Reviewed:    The following studies were reviewed today:   EKG:   October 13, 2017:   NSR at 55 with 1st degree AV block  Recent Labs: No results found for requested labs within last 8760 hours.   Recent Lipid Panel No results found for: CHOL, TRIG, HDL, CHOLHDL, VLDL, LDLCALC, LDLDIRECT   ASSESSMENT:    1. DOE (dyspnea on exertion)   2. Severe concentric left ventricular hypertrophy   3. Essential hypertension    PLAN:    In order of problems listed  above:  1. 1.  Shortness of breath with exertion: The  patient has a long history of hypertension.  It typically was not well controlled for many years.  It very well controlled today.  Suspect that her shortness of breath with exertion is due to her left ventricular hypertrophy with diastolic CHF.  We will get a [place] study to rule out coronary artery disease as a potential cause.  Have encouraged her to avoid using any excessive salt.  We do not need any blood pressure medication adjustment at this time.  If the Myoview study is normal then I will see her on an as-needed basis.  She will continue to follow-up with her general medical doctor.  2.  Severe left ventricular hypertrophy: The patient has already had a serum protein  electrophoresis and urine protein electrophoresis. There is no evidence of amyloidosis.  I do not think that she needs any further evaluation for cardiac amyloidosis.    Medication Adjustments/Labs and Tests Ordered: Current medicines are reviewed at length with the patient today.  Concerns regarding medicines are outlined above.  Orders Placed This Encounter  Procedures  . Myocardial Perfusion Imaging  . EKG 12-Lead   No orders of the defined types were placed in this encounter.   Patient Instructions  Medication Instructions:  Your physician recommends that you continue on your current medications as directed. Please refer to the Current Medication list given to you today.   Labwork: None Ordered   Testing/Procedures: Your physician has requested that you have a lexiscan myoview. For further information please visit [email or web address]. Please follow instruction sheet, as given.   Follow-Up: Your physician recommends that you schedule a follow-up appointment in: as needed with Dr. Acie Fredrickson   If you need a refill on your cardiac medications before your next appointment, please call your pharmacy.   Thank you for choosing [facility]! Christen Bame, RN [phone]       Signed, Laura Moores, MD  10/13/2017 10:59 AM     [facility]

## 2017-10-13 NOTE — Patient Instructions (Signed)
Medication Instructions:  Your physician recommends that you continue on your current medications as directed. Please refer to the Current Medication list given to you today.   Labwork: None Ordered   Testing/Procedures: Your physician has requested that you have a lexiscan myoview. For further information please visit [email or web address]. Please follow instruction sheet, as given.   Follow-Up: Your physician recommends that you schedule a follow-up appointment in: as needed with Dr. Nahser.    If you need a refill on your cardiac medications before your next appointment, please call your pharmacy.   Thank you for choosing [facility]! Michelle Swinyer, RN [phone]   

## 2017-10-16 ENCOUNTER — Encounter: Payer: Self-pay | Admitting: Podiatry

## 2017-10-16 ENCOUNTER — Ambulatory Visit: Payer: Medicare Other | Admitting: Podiatry

## 2017-10-16 DIAGNOSIS — B351 Tinea unguium: Secondary | ICD-10-CM

## 2017-10-16 DIAGNOSIS — M79609 Pain in unspecified limb: Secondary | ICD-10-CM | POA: Diagnosis not present

## 2017-10-16 NOTE — Progress Notes (Signed)
Subjective:  Patient ID: Laura Cruz, female    DOB: 29-Aug-1940,  MRN: [id number]  Chief Complaint  Patient presents with  . Nail Problem    right great toenail is thick and discolored   77 y.o. female returns for the above complaint. Reports great toenail thickening to both feet. Reports pain because of the thickness. Gets routine pedicures.   Past Medical History:  Diagnosis Date  . Angina   . Anxiety   . Arthritis   . Breast cancer ([place]) MAY 2013   LEFT BREAST  . Depression   . Diabetes mellitus    diet controlled  . Gastroesophageal reflux disease   . GERD (gastroesophageal reflux disease)   . H/O hiatal hernia   . Hypercholesteremia   . Hypertension   . Hypothyroidism   . Mass    Left ear, benign  . Osteoarthritis   . PONV (postoperative nausea and vomiting)   . S/P radiation therapy 01/07/12 - 02/10/12   LEFT BREAST / 50 Gy / 25 FRACTIONS   Past Surgical History:  Procedure Laterality Date  . BACK SURGERY     2, pinched nerve  . BILATERAL KNEE ARTHROSCOPY  2000, June 2007   Replacements  . BREAST LUMPECTOMY  11/13/11   Invasive Ductal Carcinoma. No lymphovascular Invasion; Margins Clear, 0/2  Nodes Negatie  . CARDIOVASCULAR STRESS TEST  02-24-2009   EF 68%  . HEMORRHOID SURGERY    . JOINT REPLACEMENT    . LEFT BREAST NIPPLE PUNCH BIOPSY Left 07/13/12   URTICARIAL ALLERGIC REACTION WITH FOCAL SPONGIOSIS  . PARTIAL HYSTERECTOMY  age 42   Heavt Menses  . TONSILLECTOMY    . TONSILLECTOMY    . US ECHOCARDIOGRAPHY  02-27-2009   Est EF 55-60%    Current Outpatient Medications:  .  amLODipine (NORVASC) 2.5 MG tablet, Take 2.5 mg by mouth daily.  , Disp: , Rfl:  .  aspirin 81 MG tablet, Take 81 mg by mouth daily., Disp: , Rfl:  .  atenolol (TENORMIN) 50 MG tablet, Take 50 mg by mouth daily.  , Disp: , Rfl:  .  atorvastatin (LIPITOR) 10 MG tablet, Take 10 mg by mouth daily.  , Disp: , Rfl:  .  buPROPion (WELLBUTRIN XL) 300 MG 24 hr tablet, Take 300 mg by mouth  daily.  , Disp: , Rfl:  .  fluticasone (CUTIVATE) 0.05 % cream, , Disp: , Rfl:  .  levothyroxine (SYNTHROID, LEVOTHROID) 125 MCG tablet, Take 125 mcg by mouth daily.  , Disp: , Rfl:  .  losartan-hydrochlorothiazide (HYZAAR) 50-12.5 MG per tablet, Daily., Disp: , Rfl:  .  nitroGLYCERIN (NITROSTAT) 0.4 MG SL tablet, Place 0.4 mg under the tongue every 5 (five) minutes as needed.  , Disp: , Rfl:  .  omeprazole (PRILOSEC) 20 MG capsule, Take 20 mg by mouth daily.  , Disp: , Rfl:  .  OXYCONTIN 10 MG 12 hr tablet, 5 mg. , Disp: , Rfl:  .  pravastatin (PRAVACHOL) 20 MG tablet, , Disp: , Rfl:  .  sertraline (ZOLOFT) 100 MG tablet, Take 100 mg by mouth daily.  , Disp: , Rfl:  .  traZODone (DESYREL) 100 MG tablet, Take 100 mg by mouth at bedtime. , Disp: , Rfl:  .  triamcinolone (KENALOG) 0.025 % cream, , Disp: , Rfl:   Allergies  Allergen Reactions  . Morphine And Related Other (See Comments)    "Makes me crazy."  . Cephalexin Rash    Objective:  There  were no vitals filed for this visit. General AA&O x3. Normal mood and affect.  Vascular Pedal pulses palpable.  Neurologic Epicritic sensation grossly intact.  Dermatologic No open lesions. Skin normal texture and turgor. Toenails x 10 elongated. Bilateral nail thickening.  Orthopedic: Pain to palpation about the great toenails.   Assessment & Plan:  Patient was evaluated and treated and all questions answered.  Onychomycosis with pain  -Nails palliatively debrided as below. -Educated on self-care  Procedure: Nail Debridement Rationale: pain  Type of Debridement: manual, sharp debridement. Instrumentation: Nail nipper, rotary burr. Number of Nails: 10

## 2017-10-20 DIAGNOSIS — E1122 Type 2 diabetes mellitus with diabetic chronic kidney disease: Secondary | ICD-10-CM | POA: Diagnosis not present

## 2017-10-20 DIAGNOSIS — N183 Chronic kidney disease, stage 3 (moderate): Secondary | ICD-10-CM | POA: Diagnosis not present

## 2017-10-20 DIAGNOSIS — F322 Major depressive disorder, single episode, severe without psychotic features: Secondary | ICD-10-CM | POA: Diagnosis not present

## 2017-10-20 DIAGNOSIS — I129 Hypertensive chronic kidney disease with stage 1 through stage 4 chronic kidney disease, or unspecified chronic kidney disease: Secondary | ICD-10-CM | POA: Diagnosis not present

## 2017-10-29 ENCOUNTER — Encounter (HOSPITAL_COMMUNITY): Payer: Medicare Other

## 2017-10-30 DIAGNOSIS — L308 Other specified dermatitis: Secondary | ICD-10-CM | POA: Diagnosis not present

## 2017-12-01 ENCOUNTER — Telehealth (HOSPITAL_COMMUNITY): Payer: Self-pay | Admitting: Cardiovascular Disease

## 2017-12-01 NOTE — Telephone Encounter (Signed)
pt cancelled stress test scheduled for 10/29/2017/patient will call back at later date to reschedule 11-06-17 lmom to call and resc stress test.dp 11/24/17 Called pt and lmsg for her to CB to r/s myoview..RG 11/26/17 Called pt and lmsg for her to Cb to r/s myoview.Marland KitchenRG

## 2017-12-04 ENCOUNTER — Telehealth (HOSPITAL_COMMUNITY): Payer: Self-pay | Admitting: Cardiovascular Disease

## 2017-12-04 NOTE — Telephone Encounter (Signed)
pt cancelled stress test scheduled for 10/29/2017/patient will call back at later date to reschedule 11-06-17 lmom to call and resc stress test.dp 11/24/17 Called pt and lmsg for her to CB to r/s myoview..RG 11/26/17 Called pt and lmsg for her to Cb to r/s myoview..RG 12/04/17 Called pt and lmsg for her to CB to r/s myoview.Marland KitchenRG

## 2018-01-06 DIAGNOSIS — H524 Presbyopia: Secondary | ICD-10-CM | POA: Diagnosis not present

## 2018-01-06 DIAGNOSIS — Z961 Presence of intraocular lens: Secondary | ICD-10-CM | POA: Diagnosis not present

## 2018-01-06 DIAGNOSIS — E119 Type 2 diabetes mellitus without complications: Secondary | ICD-10-CM | POA: Diagnosis not present

## 2018-01-06 DIAGNOSIS — H52203 Unspecified astigmatism, bilateral: Secondary | ICD-10-CM | POA: Diagnosis not present

## 2018-04-08 DIAGNOSIS — E1122 Type 2 diabetes mellitus with diabetic chronic kidney disease: Secondary | ICD-10-CM | POA: Diagnosis not present

## 2018-04-08 DIAGNOSIS — Z0000 Encounter for general adult medical examination without abnormal findings: Secondary | ICD-10-CM | POA: Diagnosis not present

## 2018-04-08 DIAGNOSIS — N183 Chronic kidney disease, stage 3 (moderate): Secondary | ICD-10-CM | POA: Diagnosis not present

## 2018-04-08 DIAGNOSIS — I129 Hypertensive chronic kidney disease with stage 1 through stage 4 chronic kidney disease, or unspecified chronic kidney disease: Secondary | ICD-10-CM | POA: Diagnosis not present

## 2018-04-10 ENCOUNTER — Other Ambulatory Visit: Payer: Self-pay | Admitting: Family Medicine

## 2018-04-10 DIAGNOSIS — Z1231 Encounter for screening mammogram for malignant neoplasm of breast: Secondary | ICD-10-CM

## 2018-04-10 DIAGNOSIS — Z13820 Encounter for screening for osteoporosis: Secondary | ICD-10-CM

## 2018-04-23 DIAGNOSIS — N179 Acute kidney failure, unspecified: Secondary | ICD-10-CM | POA: Diagnosis not present

## 2018-06-02 ENCOUNTER — Ambulatory Visit
Admission: RE | Admit: 2018-06-02 | Discharge: 2018-06-02 | Disposition: A | Discharge status: Home / Self Care | Payer: Medicare Other | Source: Ambulatory Visit | Attending: Family Medicine | Admitting: Family Medicine

## 2018-06-02 DIAGNOSIS — Z1231 Encounter for screening mammogram for malignant neoplasm of breast: Secondary | ICD-10-CM

## 2018-06-02 HISTORY — DX: Personal history of irradiation: Z92.3

## 2018-08-13 DIAGNOSIS — H6123 Impacted cerumen, bilateral: Secondary | ICD-10-CM | POA: Diagnosis not present

## 2018-10-21 DIAGNOSIS — N183 Chronic kidney disease, stage 3 (moderate): Secondary | ICD-10-CM | POA: Diagnosis not present

## 2018-10-21 DIAGNOSIS — F322 Major depressive disorder, single episode, severe without psychotic features: Secondary | ICD-10-CM | POA: Diagnosis not present

## 2018-10-21 DIAGNOSIS — I129 Hypertensive chronic kidney disease with stage 1 through stage 4 chronic kidney disease, or unspecified chronic kidney disease: Secondary | ICD-10-CM | POA: Diagnosis not present

## 2018-10-21 DIAGNOSIS — E1122 Type 2 diabetes mellitus with diabetic chronic kidney disease: Secondary | ICD-10-CM | POA: Diagnosis not present

## 2018-11-19 DIAGNOSIS — M549 Dorsalgia, unspecified: Secondary | ICD-10-CM | POA: Diagnosis not present

## 2018-11-19 DIAGNOSIS — M4800 Spinal stenosis, site unspecified: Secondary | ICD-10-CM | POA: Diagnosis not present

## 2018-11-19 DIAGNOSIS — Z7189 Other specified counseling: Secondary | ICD-10-CM | POA: Diagnosis not present

## 2019-01-21 DIAGNOSIS — L218 Other seborrheic dermatitis: Secondary | ICD-10-CM | POA: Diagnosis not present

## 2019-01-21 DIAGNOSIS — L308 Other specified dermatitis: Secondary | ICD-10-CM | POA: Diagnosis not present

## 2019-04-26 DIAGNOSIS — N183 Chronic kidney disease, stage 3 unspecified: Secondary | ICD-10-CM | POA: Diagnosis not present

## 2019-04-26 DIAGNOSIS — E1122 Type 2 diabetes mellitus with diabetic chronic kidney disease: Secondary | ICD-10-CM | POA: Diagnosis not present

## 2019-04-26 DIAGNOSIS — Z0000 Encounter for general adult medical examination without abnormal findings: Secondary | ICD-10-CM | POA: Diagnosis not present

## 2019-04-26 DIAGNOSIS — I129 Hypertensive chronic kidney disease with stage 1 through stage 4 chronic kidney disease, or unspecified chronic kidney disease: Secondary | ICD-10-CM | POA: Diagnosis not present

## 2019-04-28 DIAGNOSIS — N183 Chronic kidney disease, stage 3 unspecified: Secondary | ICD-10-CM | POA: Diagnosis not present

## 2019-04-28 DIAGNOSIS — E1122 Type 2 diabetes mellitus with diabetic chronic kidney disease: Secondary | ICD-10-CM | POA: Diagnosis not present

## 2019-04-28 DIAGNOSIS — E039 Hypothyroidism, unspecified: Secondary | ICD-10-CM | POA: Diagnosis not present

## 2019-04-28 DIAGNOSIS — I129 Hypertensive chronic kidney disease with stage 1 through stage 4 chronic kidney disease, or unspecified chronic kidney disease: Secondary | ICD-10-CM | POA: Diagnosis not present

## 2019-06-23 ENCOUNTER — Emergency Department (HOSPITAL_COMMUNITY): Payer: Medicare Other

## 2019-06-23 ENCOUNTER — Other Ambulatory Visit: Payer: Self-pay

## 2019-06-23 ENCOUNTER — Encounter (HOSPITAL_COMMUNITY): Payer: Self-pay

## 2019-06-23 ENCOUNTER — Emergency Department (HOSPITAL_COMMUNITY)
Admission: EM | Admit: 2019-06-23 | Discharge: 2019-06-23 | Disposition: A | Discharge status: Home / Self Care | Payer: Medicare Other | Attending: Emergency Medicine | Admitting: Emergency Medicine

## 2019-06-23 DIAGNOSIS — Z7984 Long term (current) use of oral hypoglycemic drugs: Secondary | ICD-10-CM | POA: Insufficient documentation

## 2019-06-23 DIAGNOSIS — R05 Cough: Secondary | ICD-10-CM | POA: Diagnosis not present

## 2019-06-23 DIAGNOSIS — R059 Cough, unspecified: Secondary | ICD-10-CM

## 2019-06-23 DIAGNOSIS — I10 Essential (primary) hypertension: Secondary | ICD-10-CM | POA: Diagnosis not present

## 2019-06-23 DIAGNOSIS — U071 COVID-19: Secondary | ICD-10-CM | POA: Diagnosis not present

## 2019-06-23 DIAGNOSIS — R9431 Abnormal electrocardiogram [ECG] [EKG]: Secondary | ICD-10-CM | POA: Diagnosis not present

## 2019-06-23 DIAGNOSIS — R000 Tachycardia, unspecified: Secondary | ICD-10-CM | POA: Diagnosis not present

## 2019-06-23 DIAGNOSIS — Z79899 Other long term (current) drug therapy: Secondary | ICD-10-CM | POA: Insufficient documentation

## 2019-06-23 DIAGNOSIS — E119 Type 2 diabetes mellitus without complications: Secondary | ICD-10-CM | POA: Insufficient documentation

## 2019-06-23 DIAGNOSIS — Z7982 Long term (current) use of aspirin: Secondary | ICD-10-CM | POA: Insufficient documentation

## 2019-06-23 DIAGNOSIS — Z209 Contact with and (suspected) exposure to unspecified communicable disease: Secondary | ICD-10-CM | POA: Diagnosis not present

## 2019-06-23 DIAGNOSIS — E039 Hypothyroidism, unspecified: Secondary | ICD-10-CM | POA: Diagnosis not present

## 2019-06-23 DIAGNOSIS — R0602 Shortness of breath: Secondary | ICD-10-CM | POA: Diagnosis not present

## 2019-06-23 DIAGNOSIS — Z20822 Contact with and (suspected) exposure to covid-19: Secondary | ICD-10-CM | POA: Diagnosis not present

## 2019-06-23 MED ORDER — BENZONATATE 100 MG PO CAPS: 100.0000 mg | ORAL_CAPSULE | Freq: Three times a day (TID) | ORAL | 0 refills | Status: DC

## 2019-06-23 NOTE — ED Triage Notes (Signed)
Ems reports potential covid exposure, pt complaining of sob over the last few days with it worsening today.  140/64 98%02 2L 76 HR 98*f

## 2019-06-23 NOTE — ED Notes (Signed)
All appropriate discharge materials reviewed at length with patient. Time for questions provided. Pt has no other questions at this time and verbalizes understanding of all provided materials.  

## 2019-06-23 NOTE — ED Notes (Signed)
Daughter called and updated. Going home with daughter.

## 2019-06-23 NOTE — Discharge Instructions (Addendum)
Please return to ED if any new or concerning symptoms.  Her symptoms are consistent with a viral upper respiratory tract infection.  You have been tested for coronavirus.  You will have results in 24 hours.  Your COVID test is pending; you should expect results in 2-3 days. You can access your results on your MyChart--if you test positive you should receive a phone call.  In the meantime follow CDC guidelines and quarantine, wear a mask, wash hands often.   Please take over the counter vitamin D 2000-4000 units per day. I also recommend zinc 50 mg per day for the next two weeks.   Please return to ED if you feel have difficulty breathing or have emergent, new or concerning symptoms.  Patients who have symptoms consistent with COVID-19 should self isolated for: At least 3 days (72 hours) have passed since recovery, defined as resolution of fever without the use of fever reducing medications and improvement in respiratory symptoms (e.g., cough, shortness of breath), and At least 7 days have passed since symptoms first appeared.       Person Under Monitoring Name: Laura Cruz  Location: [address] [place] [address]   Infection Prevention Recommendations for Individuals Confirmed to have, or Being Evaluated for, 2019 Novel Coronavirus (COVID-19) Infection Who Receive Care at Home  Individuals who are confirmed to have, or are being evaluated for, COVID-19 should follow the prevention steps below until a healthcare provider or local or state health department says they can return to normal activities.  Stay home except to get medical care You should restrict activities outside your home, except for getting medical care. Do not go to work, school, or public areas, and do not use public transportation or taxis.  Call ahead before visiting your doctor Before your medical appointment, call the healthcare provider and tell them that you have, or are being evaluated  for, COVID-19 infection. This will help the healthcare provider's office take steps to keep other people from getting infected. Ask your healthcare provider to call the local or state health department.  Monitor your symptoms Seek prompt medical attention if your illness is worsening (e.g., difficulty breathing). Before going to your medical appointment, call the healthcare provider and tell them that you have, or are being evaluated for, COVID-19 infection. Ask your healthcare provider to call the local or state health department.  Wear a facemask You should wear a facemask that covers your nose and mouth when you are in the same room with other people and when you visit a healthcare provider. People who live with or visit you should also wear a facemask while they are in the same room with you.  Separate yourself from other people in your home As much as possible, you should stay in a different room from other people in your home. Also, you should use a separate bathroom, if available.  Avoid sharing household items You should not share dishes, drinking glasses, cups, eating utensils, towels, bedding, or other items with other people in your home. After using these items, you should wash them thoroughly with soap and water.  Cover your coughs and sneezes Cover your mouth and nose with a tissue when you cough or sneeze, or you can cough or sneeze into your sleeve. Throw used tissues in a lined trash can, and immediately wash your hands with soap and water for at least 20 seconds or use an alcohol-based hand rub.  Wash your [facility] your hands often  and thoroughly with soap and water for at least 20 seconds. You can use an alcohol-based hand sanitizer if soap and water are not available and if your hands are not visibly dirty. Avoid touching your eyes, nose, and mouth with unwashed hands.   Prevention Steps for Caregivers and Household Members of Individuals Confirmed to have, or  Being Evaluated for, COVID-19 Infection Being Cared for in the Home  If you live with, or provide care at home for, a person confirmed to have, or being evaluated for, COVID-19 infection please follow these guidelines to prevent infection:  Follow healthcare provider's instructions Make sure that you understand and can help the patient follow any healthcare provider instructions for all care.  Provide for the patient's basic needs You should help the patient with basic needs in the home and provide support for getting groceries, prescriptions, and other personal needs.  Monitor the patient's symptoms If they are getting sicker, call his or her medical provider and tell them that the patient has, or is being evaluated for, COVID-19 infection. This will help the healthcare provider's office take steps to keep other people from getting infected. Ask the healthcare provider to call the local or state health department.  Limit the number of people who have contact with the patient If possible, have only one caregiver for the patient. Other household members should stay in another home or place of residence. If this is not possible, they should stay in another room, or be separated from the patient as much as possible. Use a separate bathroom, if available. Restrict visitors who do not have an essential need to be in the home.  Keep older adults, very young children, and other sick people away from the patient Keep older adults, very young children, and those who have compromised immune systems or chronic health conditions away from the patient. This includes people with chronic heart, lung, or kidney conditions, diabetes, and cancer.  Ensure good ventilation Make sure that shared spaces in the home have good air flow, such as from an air conditioner or an opened window, weather permitting.  Wash your hands often Wash your hands often and thoroughly with soap and water for at least 20  seconds. You can use an alcohol based hand sanitizer if soap and water are not available and if your hands are not visibly dirty. Avoid touching your eyes, nose, and mouth with unwashed hands. Use disposable paper towels to dry your hands. If not available, use dedicated cloth towels and replace them when they become wet.  Wear a facemask and gloves Wear a disposable facemask at all times in the room and gloves when you touch or have contact with the patient's blood, body fluids, and/or secretions or excretions, such as sweat, saliva, sputum, nasal mucus, vomit, urine, or feces.  Ensure the mask fits over your nose and mouth tightly, and do not touch it during use. Throw out disposable facemasks and gloves after using them. Do not reuse. Wash your hands immediately after removing your facemask and gloves. If your personal clothing becomes contaminated, carefully remove clothing and launder. Wash your hands after handling contaminated clothing. Place all used disposable facemasks, gloves, and other waste in a lined container before disposing them with other household waste. Remove gloves and wash your hands immediately after handling these items.  Do not share dishes, glasses, or other household items with the patient Avoid sharing household items. You should not share dishes, drinking glasses, cups, eating utensils, towels, bedding, or  other items with a patient who is confirmed to have, or being evaluated for, COVID-19 infection. After the person uses these items, you should wash them thoroughly with soap and water.  Wash laundry thoroughly Immediately remove and wash clothes or bedding that have blood, body fluids, and/or secretions or excretions, such as sweat, saliva, sputum, nasal mucus, vomit, urine, or feces, on them. Wear gloves when handling laundry from the patient. Read and follow directions on labels of laundry or clothing items and detergent. In general, wash and dry with the warmest  temperatures recommended on the label.  Clean all areas the individual has used often Clean all touchable surfaces, such as counters, tabletops, doorknobs, bathroom fixtures, toilets, phones, keyboards, tablets, and bedside tables, every day. Also, clean any surfaces that may have blood, body fluids, and/or secretions or excretions on them. Wear gloves when cleaning surfaces the patient has come in contact with. Use a diluted bleach solution (e.g., dilute bleach with 1 part bleach and 10 parts water) or a household disinfectant with a label that says EPA-registered for coronaviruses. To make a bleach solution at home, add 1 tablespoon of bleach to 1 quart (4 cups) of water. For a larger supply, add  cup of bleach to 1 gallon (16 cups) of water. Read labels of cleaning products and follow recommendations provided on product labels. Labels contain instructions for safe and effective use of the cleaning product including precautions you should take when applying the product, such as wearing gloves or eye protection and making sure you have good ventilation during use of the product. Remove gloves and wash hands immediately after cleaning.  Monitor yourself for signs and symptoms of illness Caregivers and household members are considered close contacts, should monitor their health, and will be asked to limit movement outside of the home to the extent possible. Follow the monitoring steps for close contacts listed on the symptom monitoring form.   ? If you have additional questions, contact your local health department or call the epidemiologist on call at [phone] (available 24/7). ? This guidance is subject to change. For the most up-to-date guidance from [facility], please refer to their website: [email or web address]

## 2019-06-23 NOTE — ED Provider Notes (Signed)
[facility]ENC[facility] DEPART[facility] Provider Note   CSN: [id number] Arrival date & time: 06/23/19  2020     History Chief Complaint  Patient presents with  . Cough    Laura Cruz is a 79 y.o. female.  The history is provided by the patient.  Cough Cough characteristics:  Dry and non-productive Severity:  Mild Onset quality:  Unable to specify Duration:  1 day Timing:  Intermittent Progression:  Unchanged Chronicity:  New Smoker: no   Context: not sick contacts   Relieved by:  None tried Worsened by:  Nothing Ineffective treatments:  None tried Associated symptoms: no chest pain, no chills, no diaphoresis, no ear fullness, no ear pain, no eye discharge, no fever, no headaches, no myalgias, no rash, no rhinorrhea, no shortness of breath, no sinus congestion, no sore throat, no weight loss and no wheezing    Patient is a 79 year old female presented today with cough.  Patient states that her cough began this morning and is not associated with any chest pain, hemoptysis, pleuritic pain, nausea, diaphoresis, weakness fatigue or body aches.  States she has had no fevers.  States that she feels otherwise well.  Patient denies any history of blood clots.  States that she has no leg pain or unilateral leg swelling.  Denies any hormonal contraceptives or hormonal medications.  States she is breathing fine and has no difficulty swallowing.  Does endorse some feeling that there is something stuck in her throat however denies any difficulty swallowing her medications or pills this morning, denies any difficulty swallowing now.  States that occasionally she has to clear her throat and this is when she has this sensation.     Past Medical History:  Diagnosis Date  . Angina   . Anxiety   . Arthritis   . Breast cancer ([place]) MAY 2013   LEFT BREAST  . Depression   . Diabetes mellitus    diet controlled  . Gastroesophageal reflux disease   . GERD (gastroesophageal reflux  disease)   . H/O hiatal hernia   . Hypercholesteremia   . Hypertension   . Hypothyroidism   . Mass    Left ear, benign  . Osteoarthritis   . Personal history of radiation therapy   . PONV (postoperative nausea and vomiting)   . S/P radiation therapy 01/07/12 - 02/10/12   LEFT BREAST / 50 Gy / 25 FRACTIONS    Patient Active Problem List   Diagnosis Date Noted  . History of breast cancer 07/13/2012  . Post-operative state 11/29/2011  . Post-op bleeding 11/19/2011  . Breast CA, left 10/23/2011  . Carcinoma of lower outer quadrant of breast ([place]) 10/23/2011  . Chest pain 02/20/2011  . HTN (hypertension) 02/20/2011    Past Surgical History:  Procedure Laterality Date  . BACK SURGERY     2, pinched nerve  . BILATERAL KNEE ARTHROSCOPY  2000, June 2007   Replacements  . BREAST BIOPSY    . BREAST LUMPECTOMY  11/13/11   Invasive Ductal Carcinoma. No lymphovascular Invasion; Margins Clear, 0/2  Nodes Negatie  . CARDIOVASCULAR STRESS TEST  02-24-2009   EF 68%  . HEMORRHOID SURGERY    . JOINT REPLACEMENT    . LEFT BREAST NIPPLE PUNCH BIOPSY Left 07/13/12   URTICARIAL ALLERGIC REACTION WITH FOCAL SPONGIOSIS  . PARTIAL HYSTERECTOMY  age 70   Heavt Menses  . TONSILLECTOMY    . TONSILLECTOMY    . US ECHOCARDIOGRAPHY  02-27-2009   Est EF  55-60%     OB History    Gravida  2   Para  2   Term      Preterm      AB      Living        SAB      TAB      Ectopic      Multiple      Live Births           Obstetric Comments  Menses age 82, Parity age 25 , Hysterectomy age 81 for heavy menses'HRT x 15 years-stopped on 10/16/11        Family History  Problem Relation Age of Onset  . Diabetes Mother   . Hypertension Father   . Stroke Father   . Prostate cancer Father   . Coronary artery disease Father   . Hypertension Sister   . Hypertension Brother   . Hypertension Brother   . Lung cancer Sister     Social History   Tobacco Use  . Smoking status: Never Smoker    . Smokeless tobacco: Never Used  Substance Use Topics  . Alcohol use: No  . Drug use: No    Home Medications Prior to Admission medications   Medication Sig Start Date End Date Taking? Authorizing Provider  amLODipine (NORVASC) 2.5 MG tablet Take 2.5 mg by mouth daily.      [provider]  aspirin 81 MG tablet Take 81 mg by mouth daily.    [provider]  atenolol (TENORMIN) 50 MG tablet Take 50 mg by mouth daily.      [provider]  atorvastatin (LIPITOR) 10 MG tablet Take 10 mg by mouth daily.      [provider]  benzonatate (TESSALON) 100 MG capsule Take 1 capsule (100 mg total) by mouth every 8 (eight) hours. 06/23/19   Tedd Sias, PA  buPROPion (WELLBUTRIN XL) 300 MG 24 hr tablet Take 300 mg by mouth daily.      [provider]  fluticasone (CUTIVATE) 0.05 % cream  08/10/13   [provider]  levothyroxine (SYNTHROID, LEVOTHROID) 125 MCG tablet Take 125 mcg by mouth daily.      [provider]  losartan-hydrochlorothiazide (HYZAAR) 50-12.5 MG per tablet Daily. 12/16/11   [provider]  nitroGLYCERIN (NITROSTAT) 0.4 MG SL tablet Place 0.4 mg under the tongue every 5 (five) minutes as needed.      [provider]  omeprazole (PRILOSEC) 20 MG capsule Take 20 mg by mouth daily.      [provider]  OXYCONTIN 10 MG 12 hr tablet 5 mg.  01/10/12   [provider]  pravastatin (PRAVACHOL) 20 MG tablet  09/20/13   [provider]  sertraline (ZOLOFT) 100 MG tablet Take 100 mg by mouth daily.      [provider]  traZODone (DESYREL) 100 MG tablet Take 100 mg by mouth at bedtime.     [provider]  triamcinolone (KENALOG) 0.025 % cream  08/30/13   [provider]    Allergies    Morphine and related and Cephalexin  Review of Systems   Review of Systems  Constitutional: Negative for chills, diaphoresis, fever and weight loss.  HENT: Negative for  congestion, ear pain, rhinorrhea and sore throat.   Eyes: Negative for pain and discharge.  Respiratory: Positive for cough. Negative for shortness of breath and wheezing.   Cardiovascular: Negative for chest pain and leg swelling.  Gastrointestinal: Negative for abdominal pain and vomiting.  Genitourinary: Negative for dysuria.  Musculoskeletal: Negative for myalgias.  Skin: Negative for rash.  Neurological: Negative for dizziness and headaches.    Physical Exam Updated Vital Signs BP (!) 161/62   Pulse 66   Temp 98.7 F (37.1 C) (Oral)   Resp 14   Ht 5\' 5"  (1.651 m)   Wt 65.8 kg   SpO2 97%   BMI 24.13 kg/m   Physical Exam Vitals and nursing note reviewed.  Constitutional:      General: She is not in acute distress.    Comments: Patient appears no acute distress.  HENT:     Head: Normocephalic and atraumatic.     Nose: Nose normal.     Mouth/Throat:     Comments: Mucous membranes are moist no erythema or swelling of the tongue or posterior pharynx.  No tonsillar exudate. Some mild PND noted in the posterior pharynx.  Eyes:     General: No scleral icterus. Neck:     Comments: No thyromegaly, trachea midline, no cervical lymphadenopathy, no neck swelling.  No rash to the neck. Cardiovascular:     Rate and Rhythm: Normal rate and regular rhythm.     Pulses: Normal pulses.     Heart sounds: Normal heart sounds.  Pulmonary:     Effort: Pulmonary effort is normal. No respiratory distress.     Breath sounds: No wheezing.     Comments: No increased work of breathing, not tachypneic, no sternal notching or nasal flaring, speaking in full sentences without difficulty. Abdominal:     General: There is no distension.     Palpations: Abdomen is soft. There is no mass.     Tenderness: There is no abdominal tenderness.  Musculoskeletal:     Cervical back: Normal range of motion.     Right lower leg: No edema.     Left lower leg: No edema.  Skin:    General: Skin is warm and  dry.     Capillary Refill: Capillary refill takes less than 2 seconds.  Neurological:     Mental Status: She is alert. Mental status is at baseline.  Psychiatric:        Mood and Affect: Mood normal.        Behavior: Behavior normal.     ED Results / Procedures / Treatments   Labs (all labs ordered are listed, but only abnormal results are displayed) Labs Reviewed  SARS CORONAVIRUS 2 (TAT 6-24 HRS)    EKG EKG Interpretation  Date/Time:  Wednesday June 23 2019 20:29:27 EST Ventricular Rate:  75 PR Interval:    QRS Duration: 124 QT Interval:  449 QTC Calculation: 502 R Axis:   -20 Text Interpretation: Sinus rhythm Prolonged PR interval Left bundle branch block Confirmed by Nat Christen [phone]) on 06/23/2019 8:37:44 PM   Radiology DG Chest Portable 1 View  Result Date: 06/23/2019 CLINICAL DATA:  79 year old female with shortness of breath. EXAM: PORTABLE CHEST 1 VIEW COMPARISON:  Chest radiograph dated 03/20/2017 FINDINGS: . no focal consolidation, pleural/that no focal consolidation, pleural effusion, or pneumothorax. The cardiac silhouette is within normal limits. No acute osseous pathology. Surgical clips noted over the left breast. IMPRESSION: No active disease. Electronically Signed   By: Anner Crete M.D.   On: 06/23/2019 21:29    Procedures Procedures (including critical care time)  Medications Ordered in ED Medications - No data to display  ED Course  I have reviewed the triage vital signs  and the nursing notes.  Pertinent labs & imaging results that were available during my care of the patient were reviewed by me and considered in my medical decision making (see chart for details).    MDM Rules/Calculators/A&P                        Laura Cruz was evaluated in Emergency Department on 06/24/2019 for the symptoms described in the history of present illness. She was evaluated in the context of the global COVID-19 pandemic, which necessitated consideration  that the patient might be at risk for infection with the SARS-CoV-2 virus that causes COVID-19. Institutional protocols and algorithms that pertain to the evaluation of patients at risk for COVID-19 are in a state of rapid change based on information released by regulatory bodies including the CDC and federal and state organizations. These policies and algorithms were followed during the patient's care in the ED.  Patient is well-appearing 79 year old female.  Patient presents today for cough and sensation in her throat that she has to clear her throat.  States that she would like to have this addressed.  I suspect that she has some postnasal drainage and congestion draining onto her vocal cords causing her to have to clear her throat.  She states she has no shortness of breath or chest pain.  She was brought in by EMS and thought to have an abnormal EKG however it shows a stable left bundle branch block that was present on her last EKG.  She has no chest pain.  Did not apply scar posterior criteria as patient does not have any symptoms on history or physical exam that indicate ACS.  Patient denies any Covid exposure will however test her with send out lab today.  Chest x-ray with no evidence of acute abnormality.  EKG discussed above is within normal limits although abnormal.  The medical records were personally reviewed by myself. I personally reviewed all lab results and interpreted all imaging studies and either concurred with their official read or contacted radiology for clarification.   This patient appears reasonably screened and I doubt any other medical condition requiring further workup, evaluation, or treatment in the ED at this time prior to discharge.   Patient's vitals are WNL apart from vital sign abnormalities discussed above, patient is in NAD, and able to ambulate in the ED at their baseline. Pain has been managed or a plan has been made for home management and has no complaints prior to  discharge. Patient is comfortable with above plan and for discharge at this time. All questions were answered prior to disposition. Results from the ER workup discussed with the patient face to face and all questions answered to the best of my ability. The patient is safe for discharge with strict return precautions. Patient appears safe for discharge with appropriate follow-up. Conveyed my impression with the patient and they voiced understanding and are agreeable to plan.   An After Visit Summary was printed and given to the patient.  Portions of this note were generated with [employer]. Dictation errors may occur despite best attempts at proofreading.   I discussed this case with my attending physician who cosigned this note including patient's presenting symptoms, physical exam, and planned diagnostics and interventions. Attending physician stated agreement with plan or made changes to plan which were implemented.   Attending physician assessed patient at bedside.  Patient discharged with benzonatate.  Final Clinical Impression(s) / ED Diagnoses  Final diagnoses:  Cough  Suspected COVID-19 virus infection    Rx / DC Orders ED Discharge Orders         Ordered    benzonatate (TESSALON) 100 MG capsule  Every 8 hours,   Status:  Discontinued     06/23/19 2217    benzonatate (TESSALON) 100 MG capsule  Every 8 hours     06/23/19 2309           Tedd Sias, [place] 06/24/19 0018    Nat Christen, MD 06/25/19 2158

## 2019-06-24 ENCOUNTER — Telehealth (HOSPITAL_COMMUNITY): Payer: Self-pay

## 2019-06-24 DIAGNOSIS — R05 Cough: Secondary | ICD-10-CM | POA: Diagnosis not present

## 2019-06-24 LAB — SARS CORONAVIRUS 2 (TAT 6-24 HRS): SARS Coronavirus 2: POSITIVE — AB

## 2019-06-25 ENCOUNTER — Telehealth (HOSPITAL_COMMUNITY): Payer: Self-pay

## 2019-07-29 DIAGNOSIS — N179 Acute kidney failure, unspecified: Secondary | ICD-10-CM | POA: Diagnosis not present

## 2019-08-12 DIAGNOSIS — H353132 Nonexudative age-related macular degeneration, bilateral, intermediate dry stage: Secondary | ICD-10-CM | POA: Diagnosis not present

## 2019-08-12 DIAGNOSIS — E119 Type 2 diabetes mellitus without complications: Secondary | ICD-10-CM | POA: Diagnosis not present

## 2019-08-12 DIAGNOSIS — H52203 Unspecified astigmatism, bilateral: Secondary | ICD-10-CM | POA: Diagnosis not present

## 2019-08-12 DIAGNOSIS — H524 Presbyopia: Secondary | ICD-10-CM | POA: Diagnosis not present

## 2019-09-01 DIAGNOSIS — L820 Inflamed seborrheic keratosis: Secondary | ICD-10-CM | POA: Diagnosis not present

## 2019-09-01 DIAGNOSIS — B078 Other viral warts: Secondary | ICD-10-CM | POA: Diagnosis not present

## 2019-10-26 DIAGNOSIS — E1122 Type 2 diabetes mellitus with diabetic chronic kidney disease: Secondary | ICD-10-CM | POA: Diagnosis not present

## 2019-10-26 DIAGNOSIS — D692 Other nonthrombocytopenic purpura: Secondary | ICD-10-CM | POA: Diagnosis not present

## 2019-10-26 DIAGNOSIS — I129 Hypertensive chronic kidney disease with stage 1 through stage 4 chronic kidney disease, or unspecified chronic kidney disease: Secondary | ICD-10-CM | POA: Diagnosis not present

## 2019-10-26 DIAGNOSIS — E039 Hypothyroidism, unspecified: Secondary | ICD-10-CM | POA: Diagnosis not present

## 2019-11-08 DIAGNOSIS — B078 Other viral warts: Secondary | ICD-10-CM | POA: Diagnosis not present

## 2020-02-28 ENCOUNTER — Encounter (INDEPENDENT_AMBULATORY_CARE_PROVIDER_SITE_OTHER): Payer: Medicare Other | Admitting: Ophthalmology

## 2020-02-28 DIAGNOSIS — H35361 Drusen (degenerative) of macula, right eye: Secondary | ICD-10-CM | POA: Insufficient documentation

## 2020-02-28 DIAGNOSIS — H43811 Vitreous degeneration, right eye: Secondary | ICD-10-CM | POA: Insufficient documentation

## 2020-02-28 DIAGNOSIS — H353132 Nonexudative age-related macular degeneration, bilateral, intermediate dry stage: Secondary | ICD-10-CM | POA: Insufficient documentation

## 2020-03-31 DIAGNOSIS — H6123 Impacted cerumen, bilateral: Secondary | ICD-10-CM | POA: Diagnosis not present

## 2020-03-31 DIAGNOSIS — J342 Deviated nasal septum: Secondary | ICD-10-CM | POA: Diagnosis not present

## 2020-03-31 DIAGNOSIS — J343 Hypertrophy of nasal turbinates: Secondary | ICD-10-CM | POA: Diagnosis not present

## 2020-04-21 DIAGNOSIS — H903 Sensorineural hearing loss, bilateral: Secondary | ICD-10-CM | POA: Diagnosis not present

## 2020-05-01 DIAGNOSIS — E1122 Type 2 diabetes mellitus with diabetic chronic kidney disease: Secondary | ICD-10-CM | POA: Diagnosis not present

## 2020-05-01 DIAGNOSIS — F322 Major depressive disorder, single episode, severe without psychotic features: Secondary | ICD-10-CM | POA: Diagnosis not present

## 2020-05-01 DIAGNOSIS — I129 Hypertensive chronic kidney disease with stage 1 through stage 4 chronic kidney disease, or unspecified chronic kidney disease: Secondary | ICD-10-CM | POA: Diagnosis not present

## 2020-05-01 DIAGNOSIS — Z0000 Encounter for general adult medical examination without abnormal findings: Secondary | ICD-10-CM | POA: Diagnosis not present

## 2020-05-03 DIAGNOSIS — M4800 Spinal stenosis, site unspecified: Secondary | ICD-10-CM | POA: Diagnosis not present

## 2020-05-03 DIAGNOSIS — I129 Hypertensive chronic kidney disease with stage 1 through stage 4 chronic kidney disease, or unspecified chronic kidney disease: Secondary | ICD-10-CM | POA: Diagnosis not present

## 2020-05-17 DIAGNOSIS — N179 Acute kidney failure, unspecified: Secondary | ICD-10-CM | POA: Diagnosis not present

## 2020-05-24 ENCOUNTER — Other Ambulatory Visit: Payer: Self-pay | Admitting: Family Medicine

## 2020-05-24 DIAGNOSIS — Z1231 Encounter for screening mammogram for malignant neoplasm of breast: Secondary | ICD-10-CM

## 2020-05-24 DIAGNOSIS — E2839 Other primary ovarian failure: Secondary | ICD-10-CM

## 2020-06-21 DIAGNOSIS — N179 Acute kidney failure, unspecified: Secondary | ICD-10-CM | POA: Diagnosis not present

## 2020-07-10 DIAGNOSIS — N179 Acute kidney failure, unspecified: Secondary | ICD-10-CM | POA: Diagnosis not present

## 2020-07-10 DIAGNOSIS — E1122 Type 2 diabetes mellitus with diabetic chronic kidney disease: Secondary | ICD-10-CM | POA: Diagnosis not present

## 2020-07-13 ENCOUNTER — Other Ambulatory Visit: Payer: Self-pay | Admitting: Family Medicine

## 2020-07-13 DIAGNOSIS — N184 Chronic kidney disease, stage 4 (severe): Secondary | ICD-10-CM

## 2020-07-13 DIAGNOSIS — I129 Hypertensive chronic kidney disease with stage 1 through stage 4 chronic kidney disease, or unspecified chronic kidney disease: Secondary | ICD-10-CM

## 2020-07-27 ENCOUNTER — Ambulatory Visit
Admission: RE | Admit: 2020-07-27 | Discharge: 2020-07-27 | Disposition: A | Discharge status: Home / Self Care | Payer: Medicare Other | Source: Ambulatory Visit | Attending: Family Medicine | Admitting: Family Medicine

## 2020-07-27 DIAGNOSIS — N184 Chronic kidney disease, stage 4 (severe): Secondary | ICD-10-CM

## 2020-07-27 DIAGNOSIS — I129 Hypertensive chronic kidney disease with stage 1 through stage 4 chronic kidney disease, or unspecified chronic kidney disease: Secondary | ICD-10-CM

## 2020-08-03 DIAGNOSIS — N184 Chronic kidney disease, stage 4 (severe): Secondary | ICD-10-CM | POA: Diagnosis not present

## 2020-08-03 DIAGNOSIS — D631 Anemia in chronic kidney disease: Secondary | ICD-10-CM | POA: Diagnosis not present

## 2020-08-03 DIAGNOSIS — E1122 Type 2 diabetes mellitus with diabetic chronic kidney disease: Secondary | ICD-10-CM | POA: Diagnosis not present

## 2020-08-03 DIAGNOSIS — I129 Hypertensive chronic kidney disease with stage 1 through stage 4 chronic kidney disease, or unspecified chronic kidney disease: Secondary | ICD-10-CM | POA: Diagnosis not present

## 2020-08-17 DIAGNOSIS — B355 Tinea imbricata: Secondary | ICD-10-CM | POA: Diagnosis not present

## 2020-08-17 DIAGNOSIS — L258 Unspecified contact dermatitis due to other agents: Secondary | ICD-10-CM | POA: Diagnosis not present

## 2020-09-07 DIAGNOSIS — L308 Other specified dermatitis: Secondary | ICD-10-CM | POA: Diagnosis not present

## 2020-10-31 DIAGNOSIS — I129 Hypertensive chronic kidney disease with stage 1 through stage 4 chronic kidney disease, or unspecified chronic kidney disease: Secondary | ICD-10-CM | POA: Diagnosis not present

## 2020-10-31 DIAGNOSIS — E1122 Type 2 diabetes mellitus with diabetic chronic kidney disease: Secondary | ICD-10-CM | POA: Diagnosis not present

## 2020-10-31 DIAGNOSIS — N184 Chronic kidney disease, stage 4 (severe): Secondary | ICD-10-CM | POA: Diagnosis not present

## 2020-10-31 DIAGNOSIS — D692 Other nonthrombocytopenic purpura: Secondary | ICD-10-CM | POA: Diagnosis not present

## 2020-11-30 ENCOUNTER — Encounter (INDEPENDENT_AMBULATORY_CARE_PROVIDER_SITE_OTHER): Payer: Medicare Other | Admitting: Ophthalmology

## 2021-01-01 DIAGNOSIS — R350 Frequency of micturition: Secondary | ICD-10-CM | POA: Diagnosis not present

## 2021-01-09 ENCOUNTER — Other Ambulatory Visit: Payer: Self-pay | Admitting: Family Medicine

## 2021-01-09 DIAGNOSIS — E2839 Other primary ovarian failure: Secondary | ICD-10-CM

## 2021-02-13 ENCOUNTER — Ambulatory Visit (INDEPENDENT_AMBULATORY_CARE_PROVIDER_SITE_OTHER): Payer: Medicare Other | Admitting: Ophthalmology

## 2021-02-13 ENCOUNTER — Other Ambulatory Visit: Payer: Self-pay

## 2021-02-13 ENCOUNTER — Encounter (INDEPENDENT_AMBULATORY_CARE_PROVIDER_SITE_OTHER): Payer: Self-pay | Admitting: Ophthalmology

## 2021-02-13 DIAGNOSIS — H43811 Vitreous degeneration, right eye: Secondary | ICD-10-CM

## 2021-02-13 DIAGNOSIS — H353132 Nonexudative age-related macular degeneration, bilateral, intermediate dry stage: Secondary | ICD-10-CM

## 2021-02-13 NOTE — Assessment & Plan Note (Signed)

## 2021-02-13 NOTE — Progress Notes (Signed)
02/13/2021     CHIEF COMPLAINT Patient presents for  Chief Complaint  Patient presents with   Retina Follow Up      HISTORY OF PRESENT ILLNESS: Laura Cruz is a 80 y.o. female who presents to the clinic today for:   HPI     Retina Follow Up   Patient presents with  Dry AMD.  In both eyes.  This started 1 year ago.  Severity is mild.  Duration of 1 year.  Since onset it is gradually worsening.        Comments   1 year fu OCT OU Pt states, "I feel like I am not seeing as well. It is very blurry." A1C: "it was normal" LBS:81       Last edited by Kendra Opitz, COA on 02/13/2021 10:44 AM.      Referring physician: [name], MD [address],  [place] [address]  HISTORICAL INFORMATION:   Selected notes from the [facility]: No current outpatient medications on file. (Ophthalmic Drugs)   No current facility-administered medications for this visit. (Ophthalmic Drugs)   Current Outpatient Medications (Other)  Medication Sig   amLODipine (NORVASC) 2.5 MG tablet Take 2.5 mg by mouth daily.     aspirin 81 MG tablet Take 81 mg by mouth daily.   atenolol (TENORMIN) 50 MG tablet Take 50 mg by mouth daily.     atorvastatin (LIPITOR) 10 MG tablet Take 10 mg by mouth daily.     benzonatate (TESSALON) 100 MG capsule Take 1 capsule (100 mg total) by mouth every 8 (eight) hours.   buPROPion (WELLBUTRIN XL) 300 MG 24 hr tablet Take 300 mg by mouth daily.     fluticasone (CUTIVATE) 0.05 % cream    levothyroxine (SYNTHROID, LEVOTHROID) 125 MCG tablet Take 125 mcg by mouth daily.     losartan-hydrochlorothiazide (HYZAAR) 50-12.5 MG per tablet Daily.   nitroGLYCERIN (NITROSTAT) 0.4 MG SL tablet Place 0.4 mg under the tongue every 5 (five) minutes as needed.     omeprazole (PRILOSEC) 20 MG capsule Take 20 mg by mouth daily.     OXYCONTIN 10 MG 12 hr tablet 5 mg.    pravastatin (PRAVACHOL) 20 MG tablet     sertraline (ZOLOFT) 100 MG tablet Take 100 mg by mouth daily.     traZODone (DESYREL) 100 MG tablet Take 100 mg by mouth at bedtime.    triamcinolone (KENALOG) 0.025 % cream    No current facility-administered medications for this visit. (Other)      REVIEW OF SYSTEMS:    ALLERGIES Allergies  Allergen Reactions   Morphine And Related Other (See Comments)    "Makes me crazy."   Cephalexin Rash    PAST MEDICAL HISTORY Past Medical History:  Diagnosis Date   Angina    Anxiety    Arthritis    Breast cancer ([place]) MAY 2013   LEFT BREAST   Depression    Diabetes mellitus    diet controlled   Gastroesophageal reflux disease    GERD (gastroesophageal reflux disease)    H/O hiatal hernia    Hypercholesteremia    Hypertension    Hypothyroidism    Mass    Left ear, benign   Osteoarthritis    Personal history of radiation therapy    PONV (postoperative nausea and vomiting)    S/P radiation therapy 01/07/12 - 02/10/12   LEFT BREAST / 50  Gy / 25 FRACTIONS   Past Surgical History:  Procedure Laterality Date   BACK SURGERY     2, pinched nerve   BILATERAL KNEE ARTHROSCOPY  2000, June 2007   Replacements   BREAST BIOPSY     BREAST LUMPECTOMY  11/13/11   Invasive Ductal Carcinoma. No lymphovascular Invasion; Margins Clear, 0/2  Nodes Negatie   CARDIOVASCULAR STRESS TEST  02-24-2009   EF 68%   HEMORRHOID SURGERY     JOINT REPLACEMENT     LEFT BREAST NIPPLE PUNCH BIOPSY Left 07/13/12   URTICARIAL ALLERGIC REACTION WITH FOCAL SPONGIOSIS   PARTIAL HYSTERECTOMY  age 63   Heavt Menses   TONSILLECTOMY     TONSILLECTOMY     US ECHOCARDIOGRAPHY  02-27-2009   Est EF 55-60%    FAMILY HISTORY Family History  Problem Relation Age of Onset   Diabetes Mother    Hypertension Father    Stroke Father    Prostate cancer Father    Coronary artery disease Father    Hypertension Sister    Hypertension Brother    Hypertension Brother    Lung cancer Sister     SOCIAL HISTORY Social  History   Tobacco Use   Smoking status: Never   Smokeless tobacco: Never  Vaping Use   Vaping Use: Never used  Substance Use Topics   Alcohol use: No   Drug use: No         OPHTHALMIC EXAM:  Base Eye Exam     Visual Acuity (ETDRS)       Right Left   Dist [place] 20/50 20/50   Dist ph [place] 20/30 -1 20/40    Correction: Glasses         Tonometry (Tonopen, 10:47 AM)       Right Left   Pressure 13 15         Pupils       Pupils Dark Light Shape React APD   Right PERRL 4 3 Round Brisk None   Left PERRL 4 4 Round Minimal None         Visual Fields (Counting fingers)       Left Right    Full Full         Extraocular Movement       Right Left    Full Full         Neuro/Psych     Oriented x3: Yes         Dilation     Both eyes: 1.0% Mydriacyl, 2.5% Phenylephrine @ 10:47 AM           Slit Lamp and Fundus Exam     External Exam       Right Left   External Normal Normal         Slit Lamp Exam       Right Left   Lids/Lashes Normal Normal   Conjunctiva/Sclera White and quiet White and quiet   Cornea Clear Clear   Anterior Chamber Deep and quiet Deep and quiet   Iris Round and reactive Round and reactive   Lens Centered posterior chamber intraocular lens Centered posterior chamber intraocular lens   Vitreous Posterior vitreous detachment Normal         Fundus Exam       Right Left   Disc Normal Normal   Macula Normal Normal   Vessels Normal Normal   Periphery Normal Normal            IMAGING  AND PROCEDURES  Imaging and Procedures for 02/13/21  OCT, Retina - OU - Both Eyes       Right Eye Central Foveal Thickness: 222. Findings include abnormal foveal contour, retinal drusen , no IRF, no SRF.   Left Eye Central Foveal Thickness: 221. Findings include no IRF, no SRF, abnormal foveal contour, retinal drusen .   Notes Incidental posterior vitreous detachment OU, no signs of CNVM              ASSESSMENT/PLAN:  Posterior vitreous detachment of right eye  The nature of posterior vitreous detachment was discussed with the patient as well as its physiology, its age prevalence, and its possible implication regarding retinal breaks and detachment.  An informational brochure was offered to the patient.  All the patient's questions were answered.  The patient was asked to return if new or different flashes or floaters develops.   Patient was instructed to contact office immediately if any new changes were noticed. I explained to the patient that vitreous inside the eye is similar to jello inside a bowl. As the jello melts it can start to pull away from the bowl, similarly the vitreous throughout our lives can begin to pull away from the retina. That process is called a posterior vitreous detachment. In some cases, the vitreous can tug hard enough on the retina to form a retinal tear. I discussed with the patient the signs and symptoms of a retinal detachment.  Do not rub the eye.    Intermediate stage nonexudative age-related macular degeneration of both eyes The nature of age--related macular degeneration was discussed with the patient as well as the distinction between dry and wet types. Checking an Amsler Grid daily with advice to return immediately should a distortion develop, was given to the patient. The patient 's smoking status now and in the past was determined and advice based on the AREDS study was provided regarding the consumption of antioxidant supplements. AREDS 2 vitamin formulation was recommended. Consumption of dark leafy vegetables and fresh fruits of various colors was recommended. Treatment modalities for wet macular degeneration particularly the use of intravitreal injections of anti-blood vessel growth factors was discussed with the patient. Avastin, Lucentis, and Eylea are the available options. On occasion, therapy includes the use of photodynamic therapy and thermal laser.  Stressed to the patient do not rub eyes.  Patient was advised to check Amsler Grid daily and return immediately if changes are noted. Instructions on using the grid were given to the patient. All patient questions were answered.  No signs of CNVM OU      ICD-10-CM   1. Intermediate stage nonexudative age-related macular degeneration of both eyes  H35.3132 OCT, Retina - OU - Both Eyes    2. Posterior vitreous detachment of right eye  H43.811       1.  OU look great, no signs of progression of the AMD to wet AMD OU.  We will continue to monitor and observe.  2.  3.  Ophthalmic Meds Ordered this visit:  No orders of the defined types were placed in this encounter.      Return in about 1 year (around 02/13/2022) for DILATE OU, OCT.  Patient Instructions  Patient to report new onset visual acuity declines or distortions   Explained the diagnoses, plan, and follow up with the patient and they expressed understanding.  Patient expressed understanding of the importance of proper follow up care.   Clent Demark Jodi Kappes M.D. Diseases & Surgery  of the Retina and Vitreous Retina [facility] Diabetic [place] 02/13/21     Abbreviations: M myopia (nearsighted); A astigmatism; H hyperopia (farsighted); P presbyopia; Mrx spectacle prescription;  CTL contact lenses; OD right eye; OS left eye; OU both eyes  XT exotropia; ET esotropia; PEK punctate epithelial keratitis; PEE punctate epithelial erosions; DES dry eye syndrome; MGD meibomian gland dysfunction; ATs artificial tears; PFAT's preservative free artificial tears; [place] nuclear sclerotic cataract; PSC posterior subcapsular cataract; ERM epi-retinal membrane; PVD posterior vitreous detachment; RD retinal detachment; DM diabetes mellitus; DR diabetic retinopathy; NPDR non-proliferative diabetic retinopathy; PDR proliferative diabetic retinopathy; CSME clinically significant macular edema; DME diabetic macular edema; dbh dot blot hemorrhages; CWS cotton wool  spot; POAG primary open angle glaucoma; C/D cup-to-disc ratio; HVF humphrey visual field; GVF goldmann visual field; OCT optical coherence tomography; IOP intraocular pressure; BRVO Branch retinal vein occlusion; CRVO central retinal vein occlusion; CRAO central retinal artery occlusion; BRAO branch retinal artery occlusion; RT retinal tear; SB scleral buckle; PPV pars plana vitrectomy; VH Vitreous hemorrhage; PRP panretinal laser photocoagulation; IVK intravitreal kenalog; VMT vitreomacular traction; MH Macular hole;  NVD neovascularization of the disc; NVE neovascularization elsewhere; AREDS age related eye disease study; ARMD age related macular degeneration; POAG primary open angle glaucoma; EBMD epithelial/anterior basement membrane dystrophy; ACIOL anterior chamber intraocular lens; IOL intraocular lens; PCIOL posterior chamber intraocular lens; Phaco/IOL phacoemulsification with intraocular lens placement; [place] photorefractive keratectomy; LASIK laser assisted in situ keratomileusis; HTN hypertension; DM diabetes mellitus; COPD chronic obstructive pulmonary disease

## 2021-02-13 NOTE — Assessment & Plan Note (Signed)
The nature of age--related macular degeneration was discussed with the patient as well as the distinction between dry and wet types. Checking an Amsler Grid daily with advice to return immediately should a distortion develop, was given to the patient. The patient 's smoking status now and in the past was determined and advice based on the AREDS study was provided regarding the consumption of antioxidant supplements. AREDS 2 vitamin formulation was recommended. Consumption of dark leafy vegetables and fresh fruits of various colors was recommended. Treatment modalities for wet macular degeneration particularly the use of intravitreal injections of anti-blood vessel growth factors was discussed with the patient. Avastin, Lucentis, and Eylea are the available options. On occasion, therapy includes the use of photodynamic therapy and thermal laser. Stressed to the patient do not rub eyes.  Patient was advised to check Amsler Grid daily and return immediately if changes are noted. Instructions on using the grid were given to the patient. All patient questions were answered.  No signs of CNVM OU

## 2021-02-13 NOTE — Patient Instructions (Signed)
Patient to report new onset visual acuity declines or distortions 

## 2021-02-28 ENCOUNTER — Ambulatory Visit: Payer: Medicare Other

## 2021-03-30 ENCOUNTER — Other Ambulatory Visit: Payer: Self-pay

## 2021-03-30 ENCOUNTER — Ambulatory Visit
Admission: RE | Admit: 2021-03-30 | Discharge: 2021-03-30 | Disposition: A | Discharge status: Home / Self Care | Payer: Medicare Other | Source: Ambulatory Visit | Attending: Family Medicine | Admitting: Family Medicine

## 2021-03-30 DIAGNOSIS — Z1231 Encounter for screening mammogram for malignant neoplasm of breast: Secondary | ICD-10-CM | POA: Diagnosis not present

## 2021-05-02 DIAGNOSIS — E782 Mixed hyperlipidemia: Secondary | ICD-10-CM | POA: Diagnosis not present

## 2021-05-02 DIAGNOSIS — E039 Hypothyroidism, unspecified: Secondary | ICD-10-CM | POA: Diagnosis not present

## 2021-05-02 DIAGNOSIS — Z0000 Encounter for general adult medical examination without abnormal findings: Secondary | ICD-10-CM | POA: Diagnosis not present

## 2021-05-02 DIAGNOSIS — E1122 Type 2 diabetes mellitus with diabetic chronic kidney disease: Secondary | ICD-10-CM | POA: Diagnosis not present

## 2021-05-02 DIAGNOSIS — I129 Hypertensive chronic kidney disease with stage 1 through stage 4 chronic kidney disease, or unspecified chronic kidney disease: Secondary | ICD-10-CM | POA: Diagnosis not present

## 2021-05-02 DIAGNOSIS — F3342 Major depressive disorder, recurrent, in full remission: Secondary | ICD-10-CM | POA: Diagnosis not present

## 2021-05-09 ENCOUNTER — Other Ambulatory Visit: Payer: Self-pay | Admitting: Family Medicine

## 2021-05-09 DIAGNOSIS — Z13820 Encounter for screening for osteoporosis: Secondary | ICD-10-CM

## 2021-07-05 ENCOUNTER — Other Ambulatory Visit: Payer: Medicare Other

## 2021-08-02 DIAGNOSIS — H524 Presbyopia: Secondary | ICD-10-CM | POA: Diagnosis not present

## 2021-08-02 DIAGNOSIS — H353132 Nonexudative age-related macular degeneration, bilateral, intermediate dry stage: Secondary | ICD-10-CM | POA: Diagnosis not present

## 2021-08-02 DIAGNOSIS — E119 Type 2 diabetes mellitus without complications: Secondary | ICD-10-CM | POA: Diagnosis not present

## 2021-08-02 DIAGNOSIS — H52203 Unspecified astigmatism, bilateral: Secondary | ICD-10-CM | POA: Diagnosis not present

## 2021-08-22 DIAGNOSIS — Z79899 Other long term (current) drug therapy: Secondary | ICD-10-CM | POA: Diagnosis not present

## 2021-08-22 DIAGNOSIS — R519 Headache, unspecified: Secondary | ICD-10-CM | POA: Diagnosis not present

## 2021-08-30 DIAGNOSIS — M542 Cervicalgia: Secondary | ICD-10-CM | POA: Diagnosis not present

## 2021-08-30 DIAGNOSIS — M791 Myalgia, unspecified site: Secondary | ICD-10-CM | POA: Diagnosis not present

## 2021-08-30 DIAGNOSIS — G518 Other disorders of facial nerve: Secondary | ICD-10-CM | POA: Diagnosis not present

## 2021-08-30 DIAGNOSIS — G43019 Migraine without aura, intractable, without status migrainosus: Secondary | ICD-10-CM | POA: Diagnosis not present

## 2021-09-13 DIAGNOSIS — M791 Myalgia, unspecified site: Secondary | ICD-10-CM | POA: Diagnosis not present

## 2021-09-13 DIAGNOSIS — M542 Cervicalgia: Secondary | ICD-10-CM | POA: Diagnosis not present

## 2021-09-13 DIAGNOSIS — G518 Other disorders of facial nerve: Secondary | ICD-10-CM | POA: Diagnosis not present

## 2021-09-13 DIAGNOSIS — G43019 Migraine without aura, intractable, without status migrainosus: Secondary | ICD-10-CM | POA: Diagnosis not present

## 2021-10-22 DIAGNOSIS — L218 Other seborrheic dermatitis: Secondary | ICD-10-CM | POA: Diagnosis not present

## 2021-10-22 DIAGNOSIS — L308 Other specified dermatitis: Secondary | ICD-10-CM | POA: Diagnosis not present

## 2021-10-31 DIAGNOSIS — D692 Other nonthrombocytopenic purpura: Secondary | ICD-10-CM | POA: Diagnosis not present

## 2021-10-31 DIAGNOSIS — E1122 Type 2 diabetes mellitus with diabetic chronic kidney disease: Secondary | ICD-10-CM | POA: Diagnosis not present

## 2021-10-31 DIAGNOSIS — I129 Hypertensive chronic kidney disease with stage 1 through stage 4 chronic kidney disease, or unspecified chronic kidney disease: Secondary | ICD-10-CM | POA: Diagnosis not present

## 2021-10-31 DIAGNOSIS — N184 Chronic kidney disease, stage 4 (severe): Secondary | ICD-10-CM | POA: Diagnosis not present

## 2021-10-31 DIAGNOSIS — E039 Hypothyroidism, unspecified: Secondary | ICD-10-CM | POA: Diagnosis not present

## 2021-11-05 ENCOUNTER — Other Ambulatory Visit: Payer: Self-pay | Admitting: Family Medicine

## 2021-11-05 ENCOUNTER — Ambulatory Visit
Admission: RE | Admit: 2021-11-05 | Discharge: 2021-11-05 | Disposition: A | Discharge status: Home / Self Care | Payer: Medicare Other | Source: Ambulatory Visit | Attending: Family Medicine | Admitting: Family Medicine

## 2021-11-05 DIAGNOSIS — M542 Cervicalgia: Secondary | ICD-10-CM | POA: Diagnosis not present

## 2021-11-08 ENCOUNTER — Other Ambulatory Visit: Payer: Self-pay | Admitting: Family Medicine

## 2021-11-08 DIAGNOSIS — M542 Cervicalgia: Secondary | ICD-10-CM

## 2021-11-15 DIAGNOSIS — E1122 Type 2 diabetes mellitus with diabetic chronic kidney disease: Secondary | ICD-10-CM | POA: Diagnosis not present

## 2021-11-15 DIAGNOSIS — N184 Chronic kidney disease, stage 4 (severe): Secondary | ICD-10-CM | POA: Diagnosis not present

## 2021-11-15 DIAGNOSIS — N1832 Chronic kidney disease, stage 3b: Secondary | ICD-10-CM | POA: Diagnosis not present

## 2021-11-15 DIAGNOSIS — I129 Hypertensive chronic kidney disease with stage 1 through stage 4 chronic kidney disease, or unspecified chronic kidney disease: Secondary | ICD-10-CM | POA: Diagnosis not present

## 2021-11-15 DIAGNOSIS — N200 Calculus of kidney: Secondary | ICD-10-CM | POA: Diagnosis not present

## 2021-11-22 ENCOUNTER — Ambulatory Visit: Payer: Medicare Other | Admitting: Neurology

## 2021-11-27 ENCOUNTER — Ambulatory Visit
Admission: RE | Admit: 2021-11-27 | Discharge: 2021-11-27 | Disposition: A | Discharge status: Home / Self Care | Payer: Medicare Other | Source: Ambulatory Visit | Attending: Family Medicine | Admitting: Family Medicine

## 2021-11-27 DIAGNOSIS — M542 Cervicalgia: Secondary | ICD-10-CM

## 2021-11-27 DIAGNOSIS — M4802 Spinal stenosis, cervical region: Secondary | ICD-10-CM | POA: Diagnosis not present

## 2021-12-24 DIAGNOSIS — M47812 Spondylosis without myelopathy or radiculopathy, cervical region: Secondary | ICD-10-CM | POA: Diagnosis not present

## 2021-12-24 DIAGNOSIS — I129 Hypertensive chronic kidney disease with stage 1 through stage 4 chronic kidney disease, or unspecified chronic kidney disease: Secondary | ICD-10-CM | POA: Diagnosis not present

## 2021-12-24 DIAGNOSIS — M4802 Spinal stenosis, cervical region: Secondary | ICD-10-CM | POA: Diagnosis not present

## 2021-12-24 DIAGNOSIS — D696 Thrombocytopenia, unspecified: Secondary | ICD-10-CM | POA: Diagnosis not present

## 2022-02-06 ENCOUNTER — Ambulatory Visit (HOSPITAL_COMMUNITY): Payer: Medicare Other | Attending: Physical Medicine and Rehabilitation | Admitting: Physical Therapy

## 2022-02-06 DIAGNOSIS — M542 Cervicalgia: Secondary | ICD-10-CM | POA: Diagnosis not present

## 2022-02-06 DIAGNOSIS — R293 Abnormal posture: Secondary | ICD-10-CM | POA: Insufficient documentation

## 2022-02-06 NOTE — Therapy (Signed)
OUTPATIENT PHYSICAL THERAPY CERVICAL EVALUATION   Patient Name: Laura Cruz MRN: [id number] DOB:01-21-1941, 81 y.o., female Today's Date: 02/06/2022   PT End of Session - 02/06/22 1602     Visit Number 1    Number of Visits 12    Date for PT Re-Evaluation 03/20/22    Authorization Type [employer] (no auth, no VL)    Progress Note Due on Visit 10    PT Start Time 6712    PT Stop Time 1600    PT Time Calculation (min) 45 min    Activity Tolerance Patient tolerated treatment well    Behavior During Therapy [facility] for tasks assessed/performed             Past Medical History:  Diagnosis Date   Angina    Anxiety    Arthritis    Breast cancer ([place]) MAY 2013   LEFT BREAST   Depression    Diabetes mellitus    diet controlled   Gastroesophageal reflux disease    GERD (gastroesophageal reflux disease)    H/O hiatal hernia    Hypercholesteremia    Hypertension    Hypothyroidism    Mass    Left ear, benign   Osteoarthritis    Personal history of radiation therapy    PONV (postoperative nausea and vomiting)    S/P radiation therapy 01/07/12 - 02/10/12   LEFT BREAST / 50 Gy / 25 FRACTIONS   Past Surgical History:  Procedure Laterality Date   BACK SURGERY     2, pinched nerve   BILATERAL KNEE ARTHROSCOPY  2000, June 2007   Replacements   BREAST BIOPSY     BREAST LUMPECTOMY  11/13/11   Invasive Ductal Carcinoma. 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 degeneration of both eyes 02/28/2020   Posterior vitreous  detachment of right eye 02/28/2020   Degenerative retinal drusen of right eye 02/28/2020   History of breast cancer 07/13/2012   Post-operative state 11/29/2011   Post-op bleeding 11/19/2011   Breast CA, left 10/23/2011   Carcinoma of lower outer quadrant of breast ([place]) 10/23/2011   Chest pain 02/20/2011   HTN (hypertension) 02/20/2011    PCP: [name] MD  REFERRING PROVIDER: Marlaine Hind, MD   REFERRING DIAG: PT eval/tx for [id number] 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 She states this began insidiously in March. She is taking medication for this but it is only mildly helpful. She has had MRI which showed multi level degeneration. She denies any previous issues with neck/ neck pain.   PERTINENT HISTORY:  B/l TKA, DM, HTN, arthritis, anxiety, hx of cancer   PAIN:  Are you having pain? Yes: NPRS scale: 9/10 Pain location: neck, back of head  Pain description: "hurts"/ depends  Aggravating factors: biofreeze Relieving factors: pills   PRECAUTIONS: None  WEIGHT BEARING RESTRICTIONS No  FALLS:  Has patient fallen in last 6 months? No  LIVING ENVIRONMENT: Lives with: lives with their spouse Lives in: House/apartment Stairs: No Has following equipment at home: Single point cane and Environmental consultant - 2 wheeled  OCCUPATION: Retired   PLOF: Independent with basic ADLs  PATIENT GOALS Keep my head from hurting   OBJECTIVE:   DIAGNOSTIC FINDINGS:   IMPRESSION: 1. Marrow edema in the right C1 and C2 lateral masses along the joint space, likely degenerative/reactive in nature. This could reflect a source of pain. 2. Mild-to-moderate spinal canal stenosis and severe right and moderate left neural foraminal stenosis at C3-C4. Moderate facet arthropathy at this level. 3. Mild spinal canal stenosis and moderate right and mild left neural foraminal stenosis at C4-C5. 4. Disc desiccation and narrowing most advanced at C6-C7. Mild spinal canal and bilateral neural foraminal stenosis at this level.  PATIENT SURVEYS:  FOTO 33% function    COGNITION: Overall cognitive status: Within functional limits for tasks assessed   SENSATION: WFL  POSTURE: rounded shoulders and forward head  PALPATION: Min/ Mod TTP about bilateral upper trap    CERVICAL ROM:   Active ROM A/PROM (deg) eval  Flexion 40  Extension 7  Right lateral flexion   Left lateral flexion   Right rotation 22  Left rotation 16   (Blank rows = not tested)  UPPER EXTREMITY ROM:  R shoulder flexion and IR limited by pain at end range   UPPER EXTREMITY MMT:  MMT Right eval Left eval  Shoulder flexion 4-(pain) 4  Shoulder extension    Shoulder abduction    Shoulder adduction    Shoulder extension    Shoulder internal rotation 4-(pain) 4  Shoulder external rotation 4-(pain) 4  Middle trapezius    Lower trapezius    Elbow flexion    Elbow extension    Wrist flexion    Wrist extension    Wrist ulnar deviation    Wrist radial deviation    Wrist pronation    Wrist supination    Grip strength     (Blank rows = not tested)   TODAY'S TREATMENT:  Eval Cervical AROM (rotation/ flex/ extension) Scap retraction Shoulder rolls    PATIENT EDUCATION:  Education details: on eval findings, POC and HEP  Person educated: Patient Education method: Explanation Education comprehension: verbalized understanding   HOME EXERCISE PROGRAM: Access Code:  [email or web address] URL: [email or web address] Date: 02/06/2022 Prepared by: Josue Hector  Exercises - Seated Scapular Retraction  - 2-3 x daily - 7 x weekly - 1 sets - 10 reps - 3 second hold - Seated Shoulder Rolls  - 2-3 x daily - 7 x weekly - 1 sets - 10 reps - Seated Cervical Rotation AROM  - 2-3 x daily - 7 x weekly - 1 sets - 10 reps - Seated Cervical Extension AROM  - 2-3 x daily - 7 x weekly - 1 sets -  10 reps  ASSESSMENT:  CLINICAL IMPRESSION: Patient is a 81 y.o. female who presents to physical therapy with complaint of neck pain. Patient demonstrates decreased strength, ROM restriction, reduced flexibility, increased tenderness to palpation and postural abnormalities which are likely contributing to symptoms of pain and are negatively impacting patient ability to perform ADLs. Patient will benefit from skilled physical therapy services to address these deficits to reduce pain and improve level of function with ADLs    OBJECTIVE IMPAIRMENTS decreased activity tolerance, decreased mobility, decreased ROM, decreased strength, hypomobility, increased fascial restrictions, impaired flexibility, impaired UE functional use, improper body mechanics, postural dysfunction, and pain.   ACTIVITY LIMITATIONS carrying, lifting, sitting, sleeping, bathing, dressing, self feeding, reach over head, hygiene/grooming, and caring for others  PARTICIPATION LIMITATIONS: meal prep, cleaning, laundry, shopping, community activity, and yard work  PERSONAL FACTORS Age are also affecting patient's functional outcome.   REHAB POTENTIAL: Good  CLINICAL DECISION MAKING: Stable/uncomplicated  EVALUATION COMPLEXITY: Low   GOALS: SHORT TERM GOALS: Target date: 02/27/2022  Patient will be independent with initial HEP and self-management strategies to improve functional outcomes Baseline:  Goal status: INITIAL    LONG TERM GOALS: Target date: 03/20/2022  Patient will be independent with  advanced HEP and self-management strategies to improve functional outcomes Baseline:  Goal status: INITIAL  2.  Patient will improve FOTO score to predicted value to indicate improvement in functional outcomes Baseline: 33% function Goal status: INITIAL  3.  Patient will demo improved bilateral cervical rotation by at least 15 degrees in order to improve ability to scan environment for safety and while driving. Baseline: See AROM Goal status: INITIAL  4. Patient will report a decrease in neck pain to no more than 3/10 for improved quality of life and ability to perform UE ADLs  Baseline: 9/10 Goal status: INITIAL  PLAN: PT FREQUENCY: 2x/week  PT DURATION: 6 weeks  PLANNED INTERVENTIONS: Therapeutic exercises, Therapeutic activity, Neuromuscular re-education, Balance training, Gait training, Patient/Family education, Joint manipulation, Joint mobilization, Stair training, Aquatic Therapy, Dry Needling, Electrical stimulation, Spinal manipulation, Spinal mobilization, Cryotherapy, Moist heat, scar mobilization, Taping, Traction, Ultrasound, Biofeedback, Ionotophoresis '4mg'$ /ml Dexamethasone, and Manual therapy.   PLAN FOR NEXT SESSION: Progress cervical AROM, postural strength and pain free RT shoulder ROM as tolerated. Manual STM to address neck restrictions and pain.   4:04 PM, 02/06/22 Josue Hector PT DPT  Physical Therapist with [facility]  [phone]

## 2022-02-08 ENCOUNTER — Telehealth (HOSPITAL_COMMUNITY): Payer: Self-pay

## 2022-02-08 ENCOUNTER — Encounter (HOSPITAL_COMMUNITY): Payer: Medicare Other

## 2022-02-08 NOTE — Telephone Encounter (Signed)
No show, called and left message concerning missed apt.  Reminded next apt date and time with contact number included if needs to cancel/reschedule in the future.    Ihor Austin, LPTA/CLT; Delana Meyer [phone]'

## 2022-02-13 ENCOUNTER — Ambulatory Visit (HOSPITAL_COMMUNITY): Payer: Medicare Other | Admitting: Physical Therapy

## 2022-02-13 ENCOUNTER — Encounter (HOSPITAL_COMMUNITY): Payer: Self-pay | Admitting: Physical Therapy

## 2022-02-13 DIAGNOSIS — M542 Cervicalgia: Secondary | ICD-10-CM | POA: Diagnosis not present

## 2022-02-13 DIAGNOSIS — R293 Abnormal posture: Secondary | ICD-10-CM | POA: Diagnosis not present

## 2022-02-13 NOTE — Therapy (Signed)
OUTPATIENT PHYSICAL THERAPY CERVICAL EVALUATION   Patient Name: Laura Cruz MRN: [id number] DOB:May 28, 1941, 81 y.o., female Today's Date: 02/13/2022   PT End of Session - 02/13/22 1340     Visit Number 2    Number of Visits 12    Date for PT Re-Evaluation 03/20/22    Authorization Type [employer] (no auth, no VL)    Progress Note Due on Visit 10    PT Start Time 9390    PT Stop Time 1425    PT Time Calculation (min) 40 min    Activity Tolerance Patient tolerated treatment well    Behavior During Therapy [facility] for tasks assessed/performed             Past Medical History:  Diagnosis Date   Angina    Anxiety    Arthritis    Breast cancer ([place]) MAY 2013   LEFT BREAST   Depression    Diabetes mellitus    diet controlled   Gastroesophageal reflux disease    GERD (gastroesophageal reflux disease)    H/O hiatal hernia    Hypercholesteremia    Hypertension    Hypothyroidism    Mass    Left ear, benign   Osteoarthritis    Personal history of radiation therapy    PONV (postoperative nausea and vomiting)    S/P radiation therapy 01/07/12 - 02/10/12   LEFT BREAST / 50 Gy / 25 FRACTIONS   Past Surgical History:  Procedure Laterality Date   BACK SURGERY     2, pinched nerve   BILATERAL KNEE ARTHROSCOPY  2000, June 2007   Replacements   BREAST BIOPSY     BREAST LUMPECTOMY  11/13/11   Invasive Ductal Carcinoma. No lymphovascular Invasion; Margins Clear, 0/2  Nodes Negatie   CARDIOVASCULAR STRESS TEST  02-24-2009   EF 68%   HEMORRHOID SURGERY     JOINT REPLACEMENT     LEFT BREAST NIPPLE PUNCH BIOPSY Left 07/13/12   URTICARIAL ALLERGIC REACTION WITH FOCAL SPONGIOSIS   PARTIAL HYSTERECTOMY  age 86   Heavt Menses   TONSILLECTOMY     TONSILLECTOMY     US ECHOCARDIOGRAPHY  02-27-2009   Est EF 55-60%   Patient Active Problem List   Diagnosis Date Noted   Intermediate stage nonexudative age-related macular degeneration of both eyes 02/28/2020   Posterior vitreous  detachment of right eye 02/28/2020   Degenerative retinal drusen of right eye 02/28/2020   History of breast cancer 07/13/2012   Post-operative state 11/29/2011   Post-op bleeding 11/19/2011   Breast CA, left 10/23/2011   Carcinoma of lower outer quadrant of breast ([place]) 10/23/2011   Chest pain 02/20/2011   HTN (hypertension) 02/20/2011    PCP: [name] MD  REFERRING PROVIDER: Marlaine Hind, MD   REFERRING DIAG: PT eval/tx for [id number] 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 Shoulder rolls are painful and also turning her head hurts. HEP was very hard to do.   PERTINENT HISTORY:  B/l TKA, DM, HTN, arthritis, anxiety, hx of cancer   PAIN:  Are you having pain? Yes: NPRS scale: 6-8/10 Pain location: neck, back of head  Pain description: "hurts"/ depends  Aggravating factors: biofreeze Relieving factors: pills   PRECAUTIONS: None  WEIGHT BEARING RESTRICTIONS No  FALLS:  Has patient fallen in last 6 months? No  LIVING ENVIRONMENT: Lives with: lives with their spouse Lives in: House/apartment Stairs: No Has following equipment at home: Single point cane and Environmental consultant - 2 wheeled  OCCUPATION: Retired   PLOF: Independent with basic ADLs  PATIENT GOALS Keep my head from hurting   OBJECTIVE:   DIAGNOSTIC FINDINGS:  IMPRESSION: 1. Marrow edema in the right C1 and C2 lateral masses along the joint space, likely degenerative/reactive in nature. This could reflect a source of pain. 2. Mild-to-moderate spinal canal stenosis and severe  right and moderate left neural foraminal stenosis at C3-C4. Moderate facet arthropathy at this level. 3. Mild spinal canal stenosis and moderate right and mild left neural foraminal stenosis at C4-C5. 4. Disc desiccation and narrowing most advanced at C6-C7. Mild spinal canal and bilateral neural foraminal stenosis at this level.  PATIENT SURVEYS:  FOTO 33% function    COGNITION: Overall cognitive status: Within functional limits for tasks assessed   SENSATION: WFL  POSTURE: rounded shoulders and forward head  PALPATION: Min/ Mod TTP about bilateral upper trap    CERVICAL ROM:   Active ROM A/PROM (deg) eval  Flexion 40  Extension 7  Right lateral flexion   Left lateral flexion   Right rotation 22  Left rotation 16   (Blank rows = not tested)  UPPER EXTREMITY ROM:  R shoulder flexion and IR limited by pain at end range   UPPER EXTREMITY MMT:  MMT Right eval Left eval  Shoulder flexion 4-(pain) 4  Shoulder extension    Shoulder abduction    Shoulder adduction    Shoulder extension    Shoulder internal rotation 4-(pain) 4  Shoulder external rotation 4-(pain) 4  Middle trapezius    Lower trapezius    Elbow flexion    Elbow extension    Wrist flexion    Wrist extension    Wrist ulnar deviation    Wrist radial deviation    Wrist pronation    Wrist supination    Grip strength     (Blank rows = not tested)   TODAY'S TREATMENT:  02/13/22 Goal review Partial range shrug (too painful) Cervical AROM (partial range) x 10 (painful) Chin tuck x 5 (needs lots of cues, poor return, painful) Seated rows RTB x 10 Seated shoulder extension RTB x10   Seated IASTM to bilat upper trap   Eval Cervical AROM (rotation/ flex/ extension) Scap retraction Shoulder rolls    PATIENT EDUCATION:  Education details: on eval findings, POC and HEP  Person educated: Patient Education method: Explanation Education comprehension: verbalized understanding   HOME  EXERCISE PROGRAM: Access Code: [email or web address] URL: [email or web address] Date: 02/06/2022 Prepared by: Josue Hector  Exercises - Seated Scapular Retraction  - 2-3 x daily - 7 x weekly - 1 sets - 10 reps - 3 second hold - Seated Shoulder Rolls  - 2-3 x daily - 7 x weekly - 1 sets - 10 reps - Seated Cervical Rotation AROM  - 2-3 x daily - 7 x weekly -  1 sets - 10 reps - Seated Cervical Extension AROM  - 2-3 x daily - 7 x weekly - 1 sets - 10 reps  ASSESSMENT:  CLINICAL IMPRESSION: Reviewed therapy goals and HEP. Patient with poor return with therapy exercise and requires max verbal cues for proper form with all activity performed today. Increased pain with all exercises performed and attempted today. Very pain limited, very guarded. Introduced manual IASTM to address pain and restriction. Tolerated well, noting improved symptoms following. Patient will continue to benefit from skilled therapy services to reduce remaining deficits and improve functional ability.     OBJECTIVE IMPAIRMENTS decreased activity tolerance, decreased mobility, decreased ROM, decreased strength, hypomobility, increased fascial restrictions, impaired flexibility, impaired UE functional use, improper body mechanics, postural dysfunction, and pain.   ACTIVITY LIMITATIONS carrying, lifting, sitting, sleeping, bathing, dressing, self feeding, reach over head, hygiene/grooming, and caring for others  PARTICIPATION LIMITATIONS: meal prep, cleaning, laundry, shopping, community activity, and yard work  PERSONAL FACTORS Age are also affecting patient's functional outcome.   REHAB POTENTIAL: Good  CLINICAL DECISION MAKING: Stable/uncomplicated  EVALUATION COMPLEXITY: Low   GOALS: SHORT TERM GOALS: Target date: 02/27/2022  Patient will be independent with initial HEP and self-management strategies to improve functional outcomes Baseline:  Goal status: INITIAL    LONG TERM GOALS: Target date:  03/20/2022  Patient will be independent with advanced HEP and self-management strategies to improve functional outcomes Baseline:  Goal status: INITIAL  2.  Patient will improve FOTO score to predicted value to indicate improvement in functional outcomes Baseline: 33% function Goal status: INITIAL  3.  Patient will demo improved bilateral cervical rotation by at least 15 degrees in order to improve ability to scan environment for safety and while driving. Baseline: See AROM Goal status: INITIAL  4. Patient will report a decrease in neck pain to no more than 3/10 for improved quality of life and ability to perform UE ADLs  Baseline: 9/10 Goal status: INITIAL  PLAN: PT FREQUENCY: 2x/week  PT DURATION: 6 weeks  PLANNED INTERVENTIONS: Therapeutic exercises, Therapeutic activity, Neuromuscular re-education, Balance training, Gait training, Patient/Family education, Joint manipulation, Joint mobilization, Stair training, Aquatic Therapy, Dry Needling, Electrical stimulation, Spinal manipulation, Spinal mobilization, Cryotherapy, Moist heat, scar mobilization, Taping, Traction, Ultrasound, Biofeedback, Ionotophoresis '4mg'$ /ml Dexamethasone, and Manual therapy.   PLAN FOR NEXT SESSION: Progress cervical AROM, postural strength and pain free RT shoulder ROM as tolerated. Manual STM to address neck restrictions and pain.   1:40 PM, 02/13/22 Josue Hector PT DPT  Physical Therapist with [facility]  [phone]

## 2022-02-14 ENCOUNTER — Encounter (INDEPENDENT_AMBULATORY_CARE_PROVIDER_SITE_OTHER): Payer: Medicare Other | Admitting: Ophthalmology

## 2022-02-15 ENCOUNTER — Ambulatory Visit (HOSPITAL_COMMUNITY): Payer: Medicare Other | Attending: Family Medicine | Admitting: Physical Therapy

## 2022-02-15 DIAGNOSIS — R293 Abnormal posture: Secondary | ICD-10-CM | POA: Diagnosis not present

## 2022-02-15 DIAGNOSIS — M542 Cervicalgia: Secondary | ICD-10-CM | POA: Diagnosis not present

## 2022-02-15 NOTE — Therapy (Signed)
OUTPATIENT PHYSICAL THERAPY CERVICAL EVALUATION   Patient Name: Laura Cruz MRN: [id number] DOB:11-22-1940, 81 y.o., female Today's Date: 02/15/2022   PT End of Session - 02/15/22 1122     Visit Number 3    Number of Visits 12    Date for PT Re-Evaluation 03/20/22    Authorization Type [employer] (no auth, no VL)    Progress Note Due on Visit 10    PT Start Time 7846    PT Stop Time 1115    PT Time Calculation (min) 40 min    Equipment Utilized During Treatment Gait belt    Activity Tolerance Patient tolerated treatment well    Behavior During Therapy WFL for tasks assessed/performed              Past Medical History:  Diagnosis Date   Angina    Anxiety    Arthritis    Breast cancer ([place]) MAY 2013   LEFT BREAST   Depression    Diabetes mellitus    diet controlled   Gastroesophageal reflux disease    GERD (gastroesophageal reflux disease)    H/O hiatal hernia    Hypercholesteremia    Hypertension    Hypothyroidism    Mass    Left ear, benign   Osteoarthritis    Personal history of radiation therapy    PONV (postoperative nausea and vomiting)    S/P radiation therapy 01/07/12 - 02/10/12   LEFT BREAST / 50 Gy / 25 FRACTIONS   Past Surgical History:  Procedure Laterality Date   BACK SURGERY     2, pinched nerve   BILATERAL KNEE ARTHROSCOPY  2000, June 2007   Replacements   BREAST BIOPSY     BREAST LUMPECTOMY  11/13/11   Invasive Ductal Carcinoma. No lymphovascular Invasion; Margins Clear, 0/2  Nodes Negatie   CARDIOVASCULAR STRESS TEST  02-24-2009   EF 68%   HEMORRHOID SURGERY     JOINT REPLACEMENT     LEFT BREAST NIPPLE PUNCH BIOPSY Left 07/13/12   URTICARIAL ALLERGIC REACTION WITH FOCAL SPONGIOSIS   PARTIAL HYSTERECTOMY  age 24   Heavt Menses   TONSILLECTOMY     TONSILLECTOMY     US ECHOCARDIOGRAPHY  02-27-2009   Est EF 55-60%   Patient Active Problem List   Diagnosis Date Noted   Intermediate stage nonexudative age-related macular degeneration  of both eyes 02/28/2020   Posterior vitreous detachment of right eye 02/28/2020   Degenerative retinal drusen of right eye 02/28/2020   History of breast cancer 07/13/2012   Post-operative state 11/29/2011   Post-op bleeding 11/19/2011   Breast CA, left 10/23/2011   Carcinoma of lower outer quadrant of breast ([place]) 10/23/2011   Chest pain 02/20/2011   HTN (hypertension) 02/20/2011    PCP: [name] MD  REFERRING PROVIDER: Marlaine Hind, MD   REFERRING DIAG: PT eval/tx for [phone] 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  Mornings are worse. PERTINENT HISTORY:  B/l TKA, DM, HTN, arthritis, anxiety, hx of cancer   PAIN:  Are you having pain? Yes: NPRS scale: 8/10 Pain location: neck, back of head  Pain description: "hurts"/ depends  Aggravating factors: biofreeze Relieving factors: pills   PRECAUTIONS: None  WEIGHT BEARING RESTRICTIONS No  FALLS:  Has patient fallen in last 6 months? No  LIVING ENVIRONMENT: Lives with: lives with their spouse Lives in: House/apartment Stairs: No Has following equipment at home: Single point cane and Environmental consultant - 2 wheeled  OCCUPATION: Retired   PLOF: Independent with basic ADLs  PATIENT GOALS Keep my head from hurting   OBJECTIVE:   DIAGNOSTIC FINDINGS:  IMPRESSION: 1. Marrow edema in the right C1 and C2 lateral masses along the joint space, likely degenerative/reactive in nature. This could reflect a source of pain. 2. Mild-to-moderate spinal canal  stenosis and severe right and moderate left neural foraminal stenosis at C3-C4. Moderate facet arthropathy at this level. 3. Mild spinal canal stenosis and moderate right and mild left neural foraminal stenosis at C4-C5. 4. Disc desiccation and narrowing most advanced at C6-C7. Mild spinal canal and bilateral neural foraminal stenosis at this level.  PATIENT SURVEYS:  FOTO 33% function    COGNITION: Overall cognitive status: Within functional limits for tasks assessed   SENSATION: WFL  POSTURE: rounded shoulders and forward head  PALPATION: Min/ Mod TTP about bilateral upper trap    CERVICAL ROM:   Active ROM A/PROM (deg) eval  Flexion 40  Extension 7  Right lateral flexion   Left lateral flexion   Right rotation 22  Left rotation 16   (Blank rows = not tested)  UPPER EXTREMITY ROM:  R shoulder flexion and IR limited by pain at end range   UPPER EXTREMITY MMT:  MMT Right eval Left eval  Shoulder flexion 4-(pain) 4  Shoulder extension    Shoulder abduction    Shoulder adduction    Shoulder extension    Shoulder internal rotation 4-(pain) 4  Shoulder external rotation 4-(pain) 4  Middle trapezius    Lower trapezius    Elbow flexion    Elbow extension    Wrist flexion    Wrist extension    Wrist ulnar deviation    Wrist radial deviation    Wrist pronation    Wrist supination    Grip strength     (Blank rows = not tested)   TODAY'S TREATMENT:             02/15/22:            Supine chin tucks with significant cuing            Scapular retraction x 10 with cuing             Cervical rotation with AA 2 sets of 5            Cervical SB with AA 2 sets of 5            Cervical isometric exercises for side bend B and extension 2 sets of 5            Manual for cervical traction suboccipital release and pettrisage to spasm areas.    02/13/22 Goal review Partial range shrug (too painful) Cervical AROM (partial range) x 10 (painful) Chin tuck x 5 (needs  lots of cues, poor return, painful) Seated rows RTB x 10 Seated shoulder extension RTB x10   Seated IASTM to bilat upper trap   Eval Cervical AROM (rotation/ flex/ extension) Scap retraction Shoulder rolls    PATIENT EDUCATION:  Education details: on eval findings, POC and HEP  Person educated: Patient Education method: Explanation Education comprehension: verbalized understanding   HOME EXERCISE PROGRAM:                       02/15/22 [email or web address]:  supine cervical retraction/scapular retraction and cervical rotation   Access Code: [email or web address] URL: [email or web address] Date: 02/06/2022 Prepared by: Josue Hector  Exercises - Seated Scapular Retraction  - 2-3 x daily - 7 x weekly - 1 sets - 10 reps - 3 second hold - Seated Shoulder Rolls  - 2-3 x daily - 7 x weekly - 1 sets - 10 reps - Seated Cervical Rotation AROM  - 2-3 x daily - 7 x weekly - 1 sets - 10 reps - Seated Cervical Extension AROM  - 2-3 x daily - 7 x weekly - 1 sets - 10 reps  ASSESSMENT:  CLINICAL IMPRESSION: PT continues to have a high amount of pain.  Attempted sitting exercises but despite verbal cuing pt had poor technique.  Therapist had pt completed exercises supine with improved technique, given pt updated HEP having pt complete exercises while supine explaining to pt that her form is much better this way and when able we will return to sitting exercises.  Pt with noted decreased pain with gentle traction,  gentle AA ROM while supine to improve range.  Due to significant decreased ROM and increased pain therapist would recommend manual traction over gun, (pt also verbalized this was preferred).   Patient will continue to benefit from skilled therapy services to reduce remaining deficits and improve functional ability.     OBJECTIVE IMPAIRMENTS decreased activity tolerance, decreased mobility, decreased ROM, decreased strength, hypomobility, increased fascial restrictions, impaired flexibility,  impaired UE functional use, improper body mechanics, postural dysfunction, and pain.   ACTIVITY LIMITATIONS carrying, lifting, sitting, sleeping, bathing, dressing, self feeding, reach over head, hygiene/grooming, and caring for others  PARTICIPATION LIMITATIONS: meal prep, cleaning, laundry, shopping, community activity, and yard work  PERSONAL FACTORS Age are also affecting patient's functional outcome.   REHAB POTENTIAL: Good  CLINICAL DECISION MAKING: Stable/uncomplicated  EVALUATION COMPLEXITY: Low   GOALS: SHORT TERM GOALS: Target date: 02/27/2022  Patient will be independent with initial HEP and self-management strategies to improve functional outcomes Baseline:  Goal status: On-going    LONG TERM GOALS: Target date: 03/20/2022  Patient will be independent with advanced HEP and self-management strategies to improve functional outcomes Baseline:  Goal status: IN PROGRESS  2.  Patient will improve FOTO score to predicted value to indicate improvement in functional outcomes Baseline: 33% function Goal status: IN PROGRESS  3.  Patient will demo improved bilateral cervical rotation by at least 15 degrees in order to improve ability to scan environment for safety and while driving. Baseline: See AROM Goal status: IN PROGRESS  4. Patient will report a decrease in neck pain to no more than 3/10 for improved quality of life and ability to perform UE ADLs  Baseline: 9/10 Goal status: IN PROGRESS  PLAN: PT FREQUENCY: 2x/week  PT DURATION: 6 weeks  PLANNED INTERVENTIONS: Therapeutic exercises, Therapeutic activity, Neuromuscular re-education, Balance training, Gait training, Patient/Family education, Joint manipulation, Joint mobilization, Stair training, Aquatic Therapy, Dry Needling, Electrical stimulation, Spinal manipulation,  Spinal mobilization, Cryotherapy, Moist heat, scar mobilization, Taping, Traction, Ultrasound, Biofeedback, Ionotophoresis '4mg'$ /ml Dexamethasone, and  Manual therapy.   PLAN FOR NEXT SESSION: Progress cervical AROM, postural strength and pain free RT shoulder ROM as tolerated. Manual STM to address neck restrictions and pain.   Rayetta Humphrey, [place] CLT [phone]   11:22

## 2022-02-19 ENCOUNTER — Ambulatory Visit (HOSPITAL_COMMUNITY): Payer: Medicare Other | Admitting: Physical Therapy

## 2022-02-19 ENCOUNTER — Encounter (HOSPITAL_COMMUNITY): Payer: Self-pay | Admitting: Physical Therapy

## 2022-02-19 DIAGNOSIS — R293 Abnormal posture: Secondary | ICD-10-CM

## 2022-02-19 DIAGNOSIS — M542 Cervicalgia: Secondary | ICD-10-CM | POA: Diagnosis not present

## 2022-02-19 NOTE — Therapy (Signed)
OUTPATIENT PHYSICAL THERAPY CERVICAL EVALUATION   Patient Name: Laura Cruz MRN: [id number] DOB:[birth year], 81 y.o., female Today's Date: 02/19/2022   PT End of Session - 02/19/22 1439     Visit Number 4    Number of Visits 12    Date for PT Re-Evaluation 03/20/22    Authorization Type [employer] (no auth, no VL)    Progress Note Due on Visit 10    PT Start Time 7510    PT Stop Time 2585    PT Time Calculation (min) 40 min    Equipment Utilized During Treatment Gait belt    Activity Tolerance Patient tolerated treatment well    Behavior During Therapy WFL for tasks assessed/performed              Past Medical History:  Diagnosis Date   Angina    Anxiety    Arthritis    Breast cancer ([place]) MAY 2013   LEFT BREAST   Depression    Diabetes mellitus    diet controlled   Gastroesophageal reflux disease    GERD (gastroesophageal reflux disease)    H/O hiatal hernia    Hypercholesteremia    Hypertension    Hypothyroidism    Mass    Left ear, benign   Osteoarthritis    Personal history of radiation therapy    PONV (postoperative nausea and vomiting)    S/P radiation therapy 01/07/12 - 02/10/12   LEFT BREAST / 50 Gy / 25 FRACTIONS   Past Surgical History:  Procedure Laterality Date   BACK SURGERY     2, pinched nerve   BILATERAL KNEE ARTHROSCOPY  2000, June 2007   Replacements   BREAST BIOPSY     BREAST LUMPECTOMY  11/13/11   Invasive Ductal Carcinoma. No lymphovascular Invasion; Margins Clear, 0/2  Nodes Negatie   CARDIOVASCULAR STRESS TEST  02-24-2009   EF 68%   HEMORRHOID SURGERY     JOINT REPLACEMENT     LEFT BREAST NIPPLE PUNCH BIOPSY Left 07/13/12   URTICARIAL ALLERGIC REACTION WITH FOCAL SPONGIOSIS   PARTIAL HYSTERECTOMY  age 54   Heavt Menses   TONSILLECTOMY     TONSILLECTOMY     US ECHOCARDIOGRAPHY  02-27-2009   Est EF 55-60%   Patient Active Problem List   Diagnosis Date Noted   Intermediate stage nonexudative age-related macular degeneration  of both eyes 02/28/2020   Posterior vitreous detachment of right eye 02/28/2020   Degenerative retinal drusen of right eye 02/28/2020   History of breast cancer 07/13/2012   Post-operative state 11/29/2011   Post-op bleeding 11/19/2011   Breast CA, left 10/23/2011   Carcinoma of lower outer quadrant of breast ([place]) 10/23/2011   Chest pain 02/20/2011   HTN (hypertension) 02/20/2011    PCP: [name] MD  REFERRING PROVIDER: Marlaine Hind, MD   REFERRING DIAG: PT eval/tx for [phone] 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   PERTINENT HISTORY:  B/l TKA, DM, HTN, arthritis, anxiety, hx of cancer   PAIN:  Are you having pain? Yes: NPRS scale: 8-9/10 Pain location: neck, back of head  Pain description: "Just hurts" Aggravating factors: biofreeze Relieving factors: pills   PRECAUTIONS: None  WEIGHT BEARING RESTRICTIONS No  FALLS:  Has patient fallen in last 6 months? No  LIVING ENVIRONMENT: Lives with: lives with their spouse Lives in: House/apartment Stairs: No Has following equipment at home: Single point cane and Environmental consultant - 2 wheeled  OCCUPATION: Retired   PLOF: Independent with basic ADLs  PATIENT GOALS Keep my head from hurting   OBJECTIVE:   DIAGNOSTIC FINDINGS:  IMPRESSION: 1. Marrow edema in the right C1 and C2 lateral masses along the joint space, likely degenerative/reactive in nature. This could reflect a source of pain. 2. Mild-to-moderate spinal canal stenosis and severe right  and moderate left neural foraminal stenosis at C3-C4. Moderate facet arthropathy at this level. 3. Mild spinal canal stenosis and moderate right and mild left neural foraminal stenosis at C4-C5. 4. Disc desiccation and narrowing most advanced at C6-C7. Mild spinal canal and bilateral neural foraminal stenosis at this level.  PATIENT SURVEYS:  FOTO 33% function    COGNITION: Overall cognitive status: Within functional limits for tasks assessed   SENSATION: WFL  POSTURE: rounded shoulders and forward head  PALPATION: Min/ Mod TTP about bilateral upper trap    CERVICAL ROM:   Active ROM A/PROM (deg) eval  Flexion 40  Extension 7  Right lateral flexion   Left lateral flexion   Right rotation 22  Left rotation 16   (Blank rows = not tested)  UPPER EXTREMITY ROM:  R shoulder flexion and IR limited by pain at end range   UPPER EXTREMITY MMT:  MMT Right eval Left eval  Shoulder flexion 4-(pain) 4  Shoulder extension    Shoulder abduction    Shoulder adduction    Shoulder extension    Shoulder internal rotation 4-(pain) 4  Shoulder external rotation 4-(pain) 4  Middle trapezius    Lower trapezius    Elbow flexion    Elbow extension    Wrist flexion    Wrist extension    Wrist ulnar deviation    Wrist radial deviation    Wrist pronation    Wrist supination    Grip strength     (Blank rows = not tested)   TODAY'S TREATMENT:  02/19/22 Supine chin tucks x10 with significant cuing            Cervical rotation with gentle AA overpressure 5" 2 sets of 5 Seated Scapular retraction x 10 Passive UT stretch 2x30" ea supine Grade II-III mobilization C 3-5 supine PA            Manual for cervical traction suboccipital release. Soft tissue mobilization Rt UT              02/15/22:            Supine chin tucks with significant cuing            Scapular retraction x 10 with cuing             Cervical rotation with AA 2 sets of 5            Cervical SB with AA 2  sets of 5  Cervical isometric exercises for side bend B and extension 2 sets of 5            Manual for cervical traction suboccipital release and pettrisage to spasm areas.    02/13/22 Goal review Partial range shrug (too painful) Cervical AROM (partial range) x 10 (painful) Chin tuck x 5 (needs lots of cues, poor return, painful) Seated rows RTB x 10 Seated shoulder extension RTB x10   Seated IASTM to bilat upper trap   Eval Cervical AROM (rotation/ flex/ extension) Scap retraction Shoulder rolls    PATIENT EDUCATION:  Education details: on eval findings, POC and HEP  Person educated: Patient Education method: Explanation Education comprehension: verbalized understanding   HOME EXERCISE PROGRAM:                       02/15/22 [email or web address]:  supine cervical retraction/scapular retraction and cervical rotation   Access Code: [email or web address] URL: [email or web address] Date: 02/06/2022 Prepared by: Josue Hector  Exercises - Seated Scapular Retraction  - 2-3 x daily - 7 x weekly - 1 sets - 10 reps - 3 second hold - Seated Shoulder Rolls  - 2-3 x daily - 7 x weekly - 1 sets - 10 reps - Seated Cervical Rotation AROM  - 2-3 x daily - 7 x weekly - 1 sets - 10 reps - Seated Cervical Extension AROM  - 2-3 x daily - 7 x weekly - 1 sets - 10 reps  ASSESSMENT:  CLINICAL IMPRESSION: Still requires significant cues for gentleness and technique with stretching, ROM, and exercises. Seemed to notice some self improvement in range and pain levels at end of session and is more encouraged. Extensive education for being less aggressive with HEP.    OBJECTIVE IMPAIRMENTS decreased activity tolerance, decreased mobility, decreased ROM, decreased strength, hypomobility, increased fascial restrictions, impaired flexibility, impaired UE functional use, improper body mechanics, postural dysfunction, and pain.   ACTIVITY LIMITATIONS carrying, lifting, sitting, sleeping, bathing,  dressing, self feeding, reach over head, hygiene/grooming, and caring for others  PARTICIPATION LIMITATIONS: meal prep, cleaning, laundry, shopping, community activity, and yard work  PERSONAL FACTORS Age are also affecting patient's functional outcome.   REHAB POTENTIAL: Good  CLINICAL DECISION MAKING: Stable/uncomplicated  EVALUATION COMPLEXITY: Low   GOALS: SHORT TERM GOALS: Target date: 02/27/2022  Patient will be independent with initial HEP and self-management strategies to improve functional outcomes Baseline:  Goal status: On-going    LONG TERM GOALS: Target date: 03/20/2022  Patient will be independent with advanced HEP and self-management strategies to improve functional outcomes Baseline:  Goal status: IN PROGRESS  2.  Patient will improve FOTO score to predicted value to indicate improvement in functional outcomes Baseline: 33% function Goal status: IN PROGRESS  3.  Patient will demo improved bilateral cervical rotation by at least 15 degrees in order to improve ability to scan environment for safety and while driving. Baseline: See AROM Goal status: IN PROGRESS  4. Patient will report a decrease in neck pain to no more than 3/10 for improved quality of life and ability to perform UE ADLs  Baseline: 9/10 Goal status: IN PROGRESS  PLAN: PT FREQUENCY: 2x/week  PT DURATION: 6 weeks  PLANNED INTERVENTIONS: Therapeutic exercises, Therapeutic activity, Neuromuscular re-education, Balance training, Gait training, Patient/Family education, Joint manipulation, Joint mobilization, Stair training, Aquatic Therapy, Dry Needling, Electrical stimulation, Spinal manipulation, Spinal mobilization, Cryotherapy, Moist heat, scar mobilization, Taping, Traction, Ultrasound, Biofeedback, Ionotophoresis '4mg'$ /ml Dexamethasone, and Manual therapy.   PLAN FOR NEXT SESSION:  Progress cervical AROM, postural strength and pain free RT shoulder ROM as tolerated. Manual STM to address neck  restrictions and pain.    Candie Mile, PT, DPT Physical Therapist [facility] [place] [facility]

## 2022-02-26 ENCOUNTER — Ambulatory Visit (HOSPITAL_COMMUNITY): Payer: Medicare Other

## 2022-02-28 ENCOUNTER — Ambulatory Visit (HOSPITAL_COMMUNITY): Payer: Medicare Other | Admitting: Physical Therapy

## 2022-03-04 ENCOUNTER — Telehealth (HOSPITAL_COMMUNITY): Payer: Self-pay | Admitting: Physical Therapy

## 2022-03-04 NOTE — Telephone Encounter (Signed)
Patient requested to be d/c - she don't think this is helping her.

## 2022-03-05 ENCOUNTER — Encounter (HOSPITAL_COMMUNITY): Payer: Medicare Other | Admitting: Physical Therapy

## 2022-03-07 ENCOUNTER — Encounter (HOSPITAL_COMMUNITY): Payer: Medicare Other | Admitting: Physical Therapy

## 2022-03-12 ENCOUNTER — Encounter (HOSPITAL_COMMUNITY): Payer: Medicare Other | Admitting: Physical Therapy

## 2022-03-14 ENCOUNTER — Encounter (HOSPITAL_COMMUNITY): Payer: Medicare Other | Admitting: Physical Therapy

## 2022-03-19 ENCOUNTER — Encounter (HOSPITAL_COMMUNITY): Payer: Medicare Other | Admitting: Physical Therapy

## 2022-03-21 ENCOUNTER — Encounter (HOSPITAL_COMMUNITY): Payer: Medicare Other | Admitting: Physical Therapy

## 2022-05-23 ENCOUNTER — Other Ambulatory Visit: Payer: Self-pay | Admitting: Family Medicine

## 2022-05-23 DIAGNOSIS — E1122 Type 2 diabetes mellitus with diabetic chronic kidney disease: Secondary | ICD-10-CM | POA: Diagnosis not present

## 2022-05-23 DIAGNOSIS — F3342 Major depressive disorder, recurrent, in full remission: Secondary | ICD-10-CM | POA: Diagnosis not present

## 2022-05-23 DIAGNOSIS — E039 Hypothyroidism, unspecified: Secondary | ICD-10-CM | POA: Diagnosis not present

## 2022-05-23 DIAGNOSIS — I129 Hypertensive chronic kidney disease with stage 1 through stage 4 chronic kidney disease, or unspecified chronic kidney disease: Secondary | ICD-10-CM | POA: Diagnosis not present

## 2022-05-23 DIAGNOSIS — Z13820 Encounter for screening for osteoporosis: Secondary | ICD-10-CM

## 2022-05-23 DIAGNOSIS — Z0000 Encounter for general adult medical examination without abnormal findings: Secondary | ICD-10-CM | POA: Diagnosis not present

## 2022-05-23 DIAGNOSIS — E782 Mixed hyperlipidemia: Secondary | ICD-10-CM | POA: Diagnosis not present

## 2022-05-23 DIAGNOSIS — Z1231 Encounter for screening mammogram for malignant neoplasm of breast: Secondary | ICD-10-CM

## 2022-06-20 DIAGNOSIS — N1832 Chronic kidney disease, stage 3b: Secondary | ICD-10-CM | POA: Diagnosis not present

## 2022-06-20 DIAGNOSIS — E1122 Type 2 diabetes mellitus with diabetic chronic kidney disease: Secondary | ICD-10-CM | POA: Diagnosis not present

## 2022-06-20 DIAGNOSIS — I129 Hypertensive chronic kidney disease with stage 1 through stage 4 chronic kidney disease, or unspecified chronic kidney disease: Secondary | ICD-10-CM | POA: Diagnosis not present

## 2022-06-20 DIAGNOSIS — N200 Calculus of kidney: Secondary | ICD-10-CM | POA: Diagnosis not present

## 2022-07-08 DIAGNOSIS — L258 Unspecified contact dermatitis due to other agents: Secondary | ICD-10-CM | POA: Diagnosis not present

## 2022-07-16 ENCOUNTER — Other Ambulatory Visit: Payer: Self-pay | Admitting: Advanced Practice Midwife

## 2022-07-24 DIAGNOSIS — R059 Cough, unspecified: Secondary | ICD-10-CM | POA: Diagnosis not present

## 2022-07-24 DIAGNOSIS — N184 Chronic kidney disease, stage 4 (severe): Secondary | ICD-10-CM | POA: Diagnosis not present

## 2022-07-24 DIAGNOSIS — E1122 Type 2 diabetes mellitus with diabetic chronic kidney disease: Secondary | ICD-10-CM | POA: Diagnosis not present

## 2022-07-24 DIAGNOSIS — Z20822 Contact with and (suspected) exposure to covid-19: Secondary | ICD-10-CM | POA: Diagnosis not present

## 2022-07-24 DIAGNOSIS — J019 Acute sinusitis, unspecified: Secondary | ICD-10-CM | POA: Diagnosis not present

## 2022-09-10 ENCOUNTER — Other Ambulatory Visit: Payer: Self-pay | Admitting: Advanced Practice Midwife

## 2022-09-26 ENCOUNTER — Other Ambulatory Visit: Payer: Self-pay | Admitting: Advanced Practice Midwife

## 2022-10-07 ENCOUNTER — Other Ambulatory Visit: Payer: Self-pay | Admitting: Advanced Practice Midwife

## 2022-10-09 ENCOUNTER — Other Ambulatory Visit: Payer: Self-pay | Admitting: Advanced Practice Midwife

## 2022-10-22 DIAGNOSIS — Z79891 Long term (current) use of opiate analgesic: Secondary | ICD-10-CM | POA: Diagnosis not present

## 2022-10-22 DIAGNOSIS — M542 Cervicalgia: Secondary | ICD-10-CM | POA: Diagnosis not present

## 2022-10-29 ENCOUNTER — Other Ambulatory Visit: Payer: Self-pay

## 2022-10-29 ENCOUNTER — Emergency Department (HOSPITAL_COMMUNITY): Payer: Medicare Other

## 2022-10-29 ENCOUNTER — Emergency Department (HOSPITAL_COMMUNITY)
Admission: EM | Admit: 2022-10-29 | Discharge: 2022-10-30 | Disposition: A | Discharge status: Home / Self Care | Payer: Medicare Other | Attending: Emergency Medicine | Admitting: Emergency Medicine

## 2022-10-29 DIAGNOSIS — T50905A Adverse effect of unspecified drugs, medicaments and biological substances, initial encounter: Secondary | ICD-10-CM | POA: Diagnosis not present

## 2022-10-29 DIAGNOSIS — I10 Essential (primary) hypertension: Secondary | ICD-10-CM | POA: Diagnosis not present

## 2022-10-29 DIAGNOSIS — R197 Diarrhea, unspecified: Secondary | ICD-10-CM | POA: Diagnosis not present

## 2022-10-29 DIAGNOSIS — F1193 Opioid use, unspecified with withdrawal: Secondary | ICD-10-CM

## 2022-10-29 DIAGNOSIS — E876 Hypokalemia: Secondary | ICD-10-CM | POA: Diagnosis not present

## 2022-10-29 DIAGNOSIS — R112 Nausea with vomiting, unspecified: Secondary | ICD-10-CM | POA: Diagnosis not present

## 2022-10-29 DIAGNOSIS — R030 Elevated blood-pressure reading, without diagnosis of hypertension: Secondary | ICD-10-CM

## 2022-10-29 DIAGNOSIS — M542 Cervicalgia: Secondary | ICD-10-CM | POA: Diagnosis not present

## 2022-10-29 DIAGNOSIS — N179 Acute kidney failure, unspecified: Secondary | ICD-10-CM | POA: Diagnosis not present

## 2022-10-29 DIAGNOSIS — Z7982 Long term (current) use of aspirin: Secondary | ICD-10-CM | POA: Diagnosis not present

## 2022-10-29 DIAGNOSIS — M545 Low back pain, unspecified: Secondary | ICD-10-CM | POA: Insufficient documentation

## 2022-10-29 DIAGNOSIS — G8929 Other chronic pain: Secondary | ICD-10-CM | POA: Diagnosis not present

## 2022-10-29 DIAGNOSIS — Z79899 Other long term (current) drug therapy: Secondary | ICD-10-CM | POA: Insufficient documentation

## 2022-10-29 DIAGNOSIS — R079 Chest pain, unspecified: Secondary | ICD-10-CM | POA: Diagnosis not present

## 2022-10-29 DIAGNOSIS — R0602 Shortness of breath: Secondary | ICD-10-CM | POA: Insufficient documentation

## 2022-10-29 LAB — BASIC METABOLIC PANEL
Anion gap: 9 (ref 5–15)
BUN: 17 mg/dL (ref 8–23)
CO2: 23 mmol/L (ref 22–32)
Calcium: 8.7 mg/dL — ABNORMAL LOW (ref 8.9–10.3)
Chloride: 102 mmol/L (ref 98–111)
Creatinine, Ser: 1.46 mg/dL — ABNORMAL HIGH (ref 0.44–1.00)
GFR, Estimated: 35 mL/min — ABNORMAL LOW (ref >60)
Glucose, Bld: 108 mg/dL — ABNORMAL HIGH (ref 70–99)
Potassium: 3.2 mmol/L — ABNORMAL LOW (ref 3.5–5.1)
Sodium: 134 mmol/L — ABNORMAL LOW (ref 135–145)

## 2022-10-29 LAB — CBC
HCT: 30.2 % — ABNORMAL LOW (ref 36.0–46.0)
Hemoglobin: 9.6 g/dL — ABNORMAL LOW (ref 12.0–15.0)
MCH: 23.7 pg — ABNORMAL LOW (ref 26.0–34.0)
MCHC: 31.8 g/dL (ref 30.0–36.0)
MCV: 74.6 fL — ABNORMAL LOW (ref 80.0–100.0)
Platelets: 108 10*3/uL — ABNORMAL LOW (ref 150–400)
RBC: 4.05 MIL/uL (ref 3.87–5.11)
RDW: 16.4 % — ABNORMAL HIGH (ref 11.5–15.5)
WBC: 3.9 10*3/uL — ABNORMAL LOW (ref 4.0–10.5)
nRBC: 0 % (ref 0.0–0.2)

## 2022-10-29 LAB — TROPONIN I (HIGH SENSITIVITY): Troponin I (High Sensitivity): 14 ng/L (ref <18)

## 2022-10-29 MED ORDER — ONDANSETRON HCL 4 MG/2ML IJ SOLN
4.0000 mg | Freq: Once | INTRAMUSCULAR | Status: AC
  Administered 2022-10-29: 4 mg via INTRAVENOUS
  Filled 2022-10-29: qty 2

## 2022-10-29 MED ORDER — POTASSIUM CHLORIDE 20 MEQ PO PACK
40.0000 meq | PACK | Freq: Once | ORAL | Status: AC
  Administered 2022-10-29: 40 meq via ORAL
  Filled 2022-10-29: qty 2

## 2022-10-29 MED ORDER — LACTATED RINGERS IV BOLUS
500.0000 mL | Freq: Once | INTRAVENOUS | Status: AC
  Administered 2022-10-29: 500 mL via INTRAVENOUS

## 2022-10-29 MED ORDER — POTASSIUM CHLORIDE CRYS ER 20 MEQ PO TBCR: EXTENDED_RELEASE_TABLET | ORAL | 0 refills | Status: DC

## 2022-10-29 MED ORDER — ONDANSETRON 8 MG PO TBDP: 8.0000 mg | ORAL_TABLET | Freq: Three times a day (TID) | ORAL | 0 refills | Status: DC | PRN

## 2022-10-29 MED ORDER — LACTATED RINGERS IV BOLUS
1000.0000 mL | Freq: Once | INTRAVENOUS | Status: AC
  Administered 2022-10-29: 1000 mL via INTRAVENOUS

## 2022-10-29 NOTE — ED Provider Notes (Addendum)
[place] EMERGENCY DEPARTMENT AT [facility] Provider Note   CSN: [id number] Arrival date & time: 10/29/22  2146     History  Chief Complaint  Patient presents with   Shortness of Breath    Laura Cruz is a 82 y.o. female.  Patient with c/o getting sick feeling approximately 30 minutes after applying a buprenorphine patch for the first time. States uses pain med on chronic basis for chronic neck and back pain, and had been taking up until used the patch. Patient indicates then notes nausea, dry heaves, and diarrhea. Patient subsequently removed the patch and washed area. Indicates felt fine earlier today, no known ill contacts, bad food ingestion, or other recent change in meds. No abd pain or distension. Has been having normal bms up until today. No dysuria or gu c/o. No fever or chills. Denies chest pain or discomfort. No sob.   The history is provided by the patient, medical records and a relative.  Shortness of Breath Associated symptoms: vomiting   Associated symptoms: no chest pain, no cough, no fever, no headaches, no rash and no sore throat        Home Medications Prior to Admission medications   Medication Sig Start Date End Date Taking? Authorizing Provider  amLODipine (NORVASC) 2.5 MG tablet Take 2.5 mg by mouth daily.      [provider]  aspirin 81 MG tablet Take 81 mg by mouth daily.    [provider]  atenolol (TENORMIN) 50 MG tablet Take 50 mg by mouth daily.      [provider]  atorvastatin (LIPITOR) 10 MG tablet Take 10 mg by mouth daily.      [provider]  benzonatate (TESSALON) 100 MG capsule Take 1 capsule (100 mg total) by mouth every 8 (eight) hours. 06/23/19   Gailen Shelter, PA  buPROPion (WELLBUTRIN XL) 300 MG 24 hr tablet Take 300 mg by mouth daily.      [provider]  fluticasone (CUTIVATE) 0.05 % cream  08/10/13   [provider]  levothyroxine (SYNTHROID, LEVOTHROID) 125 MCG  tablet Take 125 mcg by mouth daily.      [provider]  losartan-hydrochlorothiazide (HYZAAR) 50-12.5 MG per tablet Daily. 12/16/11   [provider]  nitroGLYCERIN (NITROSTAT) 0.4 MG SL tablet Place 0.4 mg under the tongue every 5 (five) minutes as needed.      [provider]  omeprazole (PRILOSEC) 20 MG capsule Take 20 mg by mouth daily.      [provider]  OXYCONTIN 10 MG 12 hr tablet 5 mg.  01/10/12   [provider]  pravastatin (PRAVACHOL) 20 MG tablet  09/20/13   [provider]  sertraline (ZOLOFT) 100 MG tablet Take 100 mg by mouth daily.      [provider]  traZODone (DESYREL) 100 MG tablet Take 100 mg by mouth at bedtime.     [provider]  triamcinolone (KENALOG) 0.025 % cream  08/30/13   [provider]      Allergies    Morphine and related and Cephalexin    Review of Systems   Review of Systems  Constitutional:  Negative for chills and fever.  HENT:  Negative for sore throat.   Eyes:  Negative for redness.  Respiratory:  Positive for shortness of breath. Negative for cough.   Cardiovascular:  Negative for chest pain.  Gastrointestinal:  Positive for diarrhea, nausea and vomiting.  Genitourinary:  Negative  for dysuria and flank pain.  Musculoskeletal:        No new neck or back pain, hx chronic pain.   Skin:  Negative for rash.  Neurological:  Negative for weakness, numbness and headaches.  Hematological:  Does not bruise/bleed easily.  Psychiatric/Behavioral:  Negative for confusion.     Physical Exam Updated Vital Signs BP (!) 191/83   Pulse 85   Temp 98.1 F (36.7 C) (Oral)   Resp 18   SpO2 99%  Physical Exam Vitals and nursing note reviewed.  Constitutional:      Appearance: Normal appearance. She is well-developed.  HENT:     Head: Atraumatic.     Nose: Nose normal.     Mouth/Throat:     Mouth: Mucous membranes are moist.  Eyes:     General: No scleral icterus.     Conjunctiva/sclera: Conjunctivae normal.     Pupils: Pupils are equal, round, and reactive to light.  Neck:     Trachea: No tracheal deviation.  Cardiovascular:     Rate and Rhythm: Normal rate and regular rhythm.     Pulses: Normal pulses.     Heart sounds: Normal heart sounds. No murmur heard.    No friction rub. No gallop.  Pulmonary:     Effort: Pulmonary effort is normal. No respiratory distress.     Breath sounds: Normal breath sounds.  Abdominal:     General: Bowel sounds are normal. There is no distension.     Palpations: Abdomen is soft. There is no mass.     Tenderness: There is no abdominal tenderness. There is no guarding or rebound.     Hernia: No hernia is present.  Genitourinary:    Comments: No cva tenderness.  Musculoskeletal:        General: No swelling or tenderness.     Cervical back: Normal range of motion and neck supple. No rigidity. No muscular tenderness.     Right lower leg: No edema.     Left lower leg: No edema.  Skin:    General: Skin is warm and dry.     Findings: No rash.  Neurological:     Mental Status: She is alert.     Comments: Alert, speech normal. +HOH. Motor/sens grossly intact bil.   Psychiatric:        Mood and Affect: Mood normal.     ED Results / Procedures / Treatments   Labs (all labs ordered are listed, but only abnormal results are displayed) Results for orders placed or performed during the hospital encounter of 10/29/22  Basic metabolic panel  Result Value Ref Range   Sodium 134 (L) 135 - 145 mmol/L   Potassium 3.2 (L) 3.5 - 5.1 mmol/L   Chloride 102 98 - 111 mmol/L   CO2 23 22 - 32 mmol/L   Glucose, Bld 108 (H) 70 - 99 mg/dL   BUN 17 8 - 23 mg/dL   Creatinine, Ser 1.61 (H) 0.44 - 1.00 mg/dL   Calcium 8.7 (L) 8.9 - 10.3 mg/dL   GFR, Estimated 35 (L) >60 mL/min   Anion gap 9 5 - 15  CBC  Result Value Ref Range   WBC 3.9 (L) 4.0 - 10.5 K/uL   RBC 4.05 3.87 - 5.11 MIL/uL   Hemoglobin 9.6 (L) 12.0 - 15.0 g/dL   HCT  09.6 (L) 04.5 - 46.0 %   MCV 74.6 (L) 80.0 - 100.0 fL   MCH 23.7 (L) 26.0 - 34.0 pg  MCHC 31.8 30.0 - 36.0 g/dL   RDW 16.1 (H) 09.6 - 04.5 %   Platelets 108 (L) 150 - 400 K/uL   nRBC 0.0 0.0 - 0.2 %  Troponin I (High Sensitivity)  Result Value Ref Range   Troponin I (High Sensitivity) 14 <18 ng/L   DG Chest Portable 1 View  Result Date: 10/29/2022 CLINICAL DATA:  Neck pain. EXAM: PORTABLE CHEST 1 VIEW COMPARISON:  June 23, 2019 FINDINGS: The heart size and mediastinal contours are within normal limits. Decreased lung volumes are seen with mild, stable elevation of the right hemidiaphragm. There is no evidence of an acute infiltrate, pleural effusion or pneumothorax. Radiopaque surgical clips are seen overlying the lateral aspect of the left lung base and soft tissues of the left breast. Multilevel degenerative changes are noted throughout the thoracic spine. IMPRESSION: Low lung volumes without evidence of acute or active cardiopulmonary disease. Electronically Signed   By: Aram Candela M.D.   On: 10/29/2022 22:31     EKG EKG Interpretation  Date/Time:  Tuesday Oct 29 2022 22:01:18 EDT Ventricular Rate:  84 PR Interval:  243 QRS Duration: 132 QT Interval:  445 QTC Calculation: 527 R Axis:   -21 Text Interpretation: Sinus rhythm Prolonged PR interval Left bundle branch block Non-specific ST-t changes Confirmed by Cathren Laine ([id number]) on 10/29/2022 10:28:16 PM  Radiology DG Chest Portable 1 View  Result Date: 10/29/2022 CLINICAL DATA:  Neck pain. EXAM: PORTABLE CHEST 1 VIEW COMPARISON:  June 23, 2019 FINDINGS: The heart size and mediastinal contours are within normal limits. Decreased lung volumes are seen with mild, stable elevation of the right hemidiaphragm. There is no evidence of an acute infiltrate, pleural effusion or pneumothorax. Radiopaque surgical clips are seen overlying the lateral aspect of the left lung base and soft tissues of the left breast. Multilevel  degenerative changes are noted throughout the thoracic spine. IMPRESSION: Low lung volumes without evidence of acute or active cardiopulmonary disease. Electronically Signed   By: Aram Candela M.D.   On: 10/29/2022 22:31    Procedures Procedures    Medications Ordered in ED Medications  lactated ringers bolus 1,000 mL (1,000 mLs Intravenous New Bag/Given 10/29/22 2259)  ondansetron (ZOFRAN) injection 4 mg (4 mg Intravenous Given 10/29/22 2259)    ED Course/ Medical Decision Making/ A&P                             Medical Decision Making Problems Addressed: Adverse effect of drug, initial encounter: acute illness or injury with systemic symptoms that poses a threat to life or bodily functions AKI (acute kidney injury) (HCC): acute illness or injury Diarrhea, unspecified type: acute illness or injury with systemic symptoms Elevated blood pressure reading: acute illness or injury Essential hypertension: chronic illness or injury with exacerbation, progression, or side effects of treatment that poses a threat to life or bodily functions Nausea and vomiting in adult: acute illness or injury with systemic symptoms that poses a threat to life or bodily functions Opioid withdrawal (HCC): acute illness or injury with systemic symptoms  Amount and/or Complexity of Data Reviewed Independent Historian:     Details: Family, hx External Data Reviewed: labs and notes. Labs: ordered. Decision-making details documented in ED Course. Radiology: ordered and independent interpretation performed. Decision-making details documented in ED Course. ECG/medicine tests: ordered and independent interpretation performed. Decision-making details documented in ED Course.  Risk Prescription drug management. Decision regarding hospitalization.   Iv ns.  Continuous pulse ox and cardiac monitoring. Labs ordered/sent. Imaging ordered.   Differential diagnosis includes medication adverse effect, withdrawal  symptoms, gastroenteritis, etc. Dispo decision including potential need for admission considered - will get labs and imaging and reassess.   Reviewed nursing notes and prior charts for additional history. External reports reviewed. Additional history from: family.   LR bolus. Zofran iv.   Cardiac monitor: sinus rhythm, rate 85.  Labs reviewed/interpreted by me - wbc not elevated. Creatinine mildly increased from remote prior. LR bolus. K low. Kcl po.   Xrays reviewed/interpreted by me - no pna.   Pt receiving ivf, will need po trial, check of UA results, recheck. Signed out to oncoming EDP, Dr Pilar Plate, to reassess and dispo appropriately.          Final Clinical Impression(s) / ED Diagnoses Final diagnoses:  None    Rx / DC Orders ED Discharge Orders     None         Cathren Laine, MD 10/29/22 2320

## 2022-10-29 NOTE — ED Triage Notes (Signed)
Pt was prescribed pain patches for neck pain. Used the first one today and has had n/v/d and shob since   Pain patch has been removed.

## 2022-10-29 NOTE — ED Provider Notes (Signed)
  Provider Note MRN:  [id number]  Arrival date & time: 10/30/22    ED Course and Medical Decision Making  Assumed care from Dr. Denton Lank at shift change.  Suspect buprenorphine patch triggered opioid withdrawal, attempting symptomatic management, candidate for discharge.  On my assessment patient is feeling a lot better and would like to go home.  Vital signs normal, has good follow-up with her PCP.  Procedures  Final Clinical Impressions(s) / ED Diagnoses     ICD-10-CM   1. Nausea and vomiting in adult  R11.2     2. Elevated blood pressure reading  R03.0     3. Essential hypertension  I10     4. Diarrhea, unspecified type  R19.7     5. 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 Adverse effect of drug, initial encounter  T50.905A     7. AKI (acute kidney injury) (HCC)  N17.9       ED Discharge Orders          Ordered    ondansetron (ZOFRAN-ODT) 8 MG disintegrating tablet  Every 8 hours PRN        10/29/22 2318    potassium chloride SA (KLOR-CON M) 20 MEQ tablet        10/29/22 2318              Discharge Instructions      It was our pleasure to provide your ER care today - we hope that you feel better.  Drink plenty of fluids/stay well hydrated. Take zofran as need for nausea.   For now, avoid taking the buprenorphine patch - contact your doctor to discuss plan with meds, including plan for ongoing pain management.   Your potassium level is mildly low - eat plenty of fruits and vegetables, take potassium supplement as prescribed, and follow up with your doctor in one week.   Return to ER if worse, new symptoms, persistent vomiting, new or worsening or severe abdominal pain, severe diarrhea, fevers, weak/fainting, chest pain, trouble breathing, or other concern.     Elmer Sow. Pilar Plate, MD [facility] Emergency Medicine [facility] [email or web address]    Sabas Sous, MD 10/30/22 [phone]

## 2022-10-29 NOTE — Discharge Instructions (Addendum)
It was our pleasure to provide your ER care today - we hope that you feel better.  Drink plenty of fluids/stay well hydrated. Take zofran as need for nausea.   For now, avoid taking the buprenorphine patch - contact your doctor to discuss plan with meds, including plan for ongoing pain management.   Your potassium level is mildly low - eat plenty of fruits and vegetables, take potassium supplement as prescribed, and follow up with your doctor in one week.   Return to ER if worse, new symptoms, persistent vomiting, new or worsening or severe abdominal pain, severe diarrhea, fevers, weak/fainting, chest pain, trouble breathing, or other concern.

## 2022-10-30 DIAGNOSIS — R112 Nausea with vomiting, unspecified: Secondary | ICD-10-CM | POA: Diagnosis not present

## 2022-10-30 LAB — URINALYSIS, ROUTINE W REFLEX MICROSCOPIC
Bacteria, UA: NONE SEEN
Bilirubin Urine: NEGATIVE
Glucose, UA: NEGATIVE mg/dL
Ketones, ur: NEGATIVE mg/dL
Nitrite: NEGATIVE
Protein, ur: NEGATIVE mg/dL
Specific Gravity, Urine: 1.003 — ABNORMAL LOW (ref 1.005–1.030)
pH: 7 (ref 5.0–8.0)

## 2022-11-12 ENCOUNTER — Other Ambulatory Visit: Payer: Self-pay | Admitting: Advanced Practice Midwife

## 2022-11-14 ENCOUNTER — Ambulatory Visit: Payer: Medicare Other

## 2022-11-14 ENCOUNTER — Other Ambulatory Visit: Payer: Medicare Other

## 2022-11-19 DIAGNOSIS — M542 Cervicalgia: Secondary | ICD-10-CM | POA: Diagnosis not present

## 2022-12-02 DIAGNOSIS — M5412 Radiculopathy, cervical region: Secondary | ICD-10-CM | POA: Diagnosis not present

## 2022-12-11 DIAGNOSIS — G9520 Unspecified cord compression: Secondary | ICD-10-CM | POA: Diagnosis not present

## 2022-12-11 DIAGNOSIS — I129 Hypertensive chronic kidney disease with stage 1 through stage 4 chronic kidney disease, or unspecified chronic kidney disease: Secondary | ICD-10-CM | POA: Diagnosis not present

## 2022-12-11 DIAGNOSIS — E782 Mixed hyperlipidemia: Secondary | ICD-10-CM | POA: Diagnosis not present

## 2022-12-11 DIAGNOSIS — N184 Chronic kidney disease, stage 4 (severe): Secondary | ICD-10-CM | POA: Diagnosis not present

## 2022-12-11 DIAGNOSIS — E1122 Type 2 diabetes mellitus with diabetic chronic kidney disease: Secondary | ICD-10-CM | POA: Diagnosis not present

## 2022-12-11 DIAGNOSIS — D696 Thrombocytopenia, unspecified: Secondary | ICD-10-CM | POA: Diagnosis not present

## 2023-01-09 DIAGNOSIS — M47812 Spondylosis without myelopathy or radiculopathy, cervical region: Secondary | ICD-10-CM | POA: Diagnosis not present

## 2023-01-10 DIAGNOSIS — D509 Iron deficiency anemia, unspecified: Secondary | ICD-10-CM | POA: Diagnosis not present

## 2023-01-20 DIAGNOSIS — I129 Hypertensive chronic kidney disease with stage 1 through stage 4 chronic kidney disease, or unspecified chronic kidney disease: Secondary | ICD-10-CM | POA: Diagnosis not present

## 2023-01-20 DIAGNOSIS — Z03818 Encounter for observation for suspected exposure to other biological agents ruled out: Secondary | ICD-10-CM | POA: Diagnosis not present

## 2023-01-20 DIAGNOSIS — R0602 Shortness of breath: Secondary | ICD-10-CM | POA: Diagnosis not present

## 2023-01-20 DIAGNOSIS — R059 Cough, unspecified: Secondary | ICD-10-CM | POA: Diagnosis not present

## 2023-01-24 DIAGNOSIS — I10 Essential (primary) hypertension: Secondary | ICD-10-CM | POA: Diagnosis not present

## 2023-01-24 DIAGNOSIS — F419 Anxiety disorder, unspecified: Secondary | ICD-10-CM | POA: Diagnosis not present

## 2023-01-31 DIAGNOSIS — F411 Generalized anxiety disorder: Secondary | ICD-10-CM | POA: Diagnosis not present

## 2023-01-31 DIAGNOSIS — I10 Essential (primary) hypertension: Secondary | ICD-10-CM | POA: Diagnosis not present

## 2023-02-04 ENCOUNTER — Emergency Department (HOSPITAL_COMMUNITY)
Admission: EM | Admit: 2023-02-04 | Discharge: 2023-02-04 | Discharge status: Against Medical Advice | Payer: Medicare Other | Source: Home / Self Care | Attending: Emergency Medicine | Admitting: Emergency Medicine

## 2023-02-04 ENCOUNTER — Encounter (HOSPITAL_COMMUNITY): Payer: Self-pay | Admitting: Emergency Medicine

## 2023-02-04 ENCOUNTER — Other Ambulatory Visit: Payer: Self-pay

## 2023-02-04 DIAGNOSIS — Z7982 Long term (current) use of aspirin: Secondary | ICD-10-CM | POA: Diagnosis not present

## 2023-02-04 DIAGNOSIS — T43201A Poisoning by unspecified antidepressants, accidental (unintentional), initial encounter: Secondary | ICD-10-CM | POA: Diagnosis not present

## 2023-02-04 DIAGNOSIS — Z5329 Procedure and treatment not carried out because of patient's decision for other reasons: Secondary | ICD-10-CM | POA: Insufficient documentation

## 2023-02-04 DIAGNOSIS — Z79899 Other long term (current) drug therapy: Secondary | ICD-10-CM | POA: Diagnosis not present

## 2023-02-04 DIAGNOSIS — I10 Essential (primary) hypertension: Secondary | ICD-10-CM | POA: Insufficient documentation

## 2023-02-04 LAB — CBC WITH DIFFERENTIAL/PLATELET
Abs Immature Granulocytes: 0.01 10*3/uL (ref 0.00–0.07)
Basophils Absolute: 0 10*3/uL (ref 0.0–0.1)
Basophils Relative: 1 %
Eosinophils Absolute: 0.3 10*3/uL (ref 0.0–0.5)
Eosinophils Relative: 6 %
HCT: 31.4 % — ABNORMAL LOW (ref 36.0–46.0)
Hemoglobin: 9.8 g/dL — ABNORMAL LOW (ref 12.0–15.0)
Immature Granulocytes: 0 %
Lymphocytes Relative: 28 %
Lymphs Abs: 1.5 10*3/uL (ref 0.7–4.0)
MCH: 25.1 pg — ABNORMAL LOW (ref 26.0–34.0)
MCHC: 31.2 g/dL (ref 30.0–36.0)
MCV: 80.5 fL (ref 80.0–100.0)
Monocytes Absolute: 0.4 10*3/uL (ref 0.1–1.0)
Monocytes Relative: 9 %
Neutro Abs: 2.9 10*3/uL (ref 1.7–7.7)
Neutrophils Relative %: 56 %
Platelets: 115 10*3/uL — ABNORMAL LOW (ref 150–400)
RBC: 3.9 MIL/uL (ref 3.87–5.11)
RDW: 20 % — ABNORMAL HIGH (ref 11.5–15.5)
WBC: 5.2 10*3/uL (ref 4.0–10.5)
nRBC: 0 % (ref 0.0–0.2)

## 2023-02-04 LAB — COMPREHENSIVE METABOLIC PANEL
ALT: 24 U/L (ref 0–44)
AST: 23 U/L (ref 15–41)
Albumin: 4 g/dL (ref 3.5–5.0)
Alkaline Phosphatase: 60 U/L (ref 38–126)
Anion gap: 10 (ref 5–15)
BUN: 23 mg/dL (ref 8–23)
CO2: 22 mmol/L (ref 22–32)
Calcium: 9 mg/dL (ref 8.9–10.3)
Chloride: 102 mmol/L (ref 98–111)
Creatinine, Ser: 1.29 mg/dL — ABNORMAL HIGH (ref 0.44–1.00)
GFR, Estimated: 41 mL/min — ABNORMAL LOW (ref >60)
Glucose, Bld: 127 mg/dL — ABNORMAL HIGH (ref 70–99)
Potassium: 3.7 mmol/L (ref 3.5–5.1)
Sodium: 134 mmol/L — ABNORMAL LOW (ref 135–145)
Total Bilirubin: 0.4 mg/dL (ref 0.3–1.2)
Total Protein: 6.3 g/dL — ABNORMAL LOW (ref 6.5–8.1)

## 2023-02-04 NOTE — ED Triage Notes (Signed)
Pts daughter reports pt took 200 mg zoloft twice today. Pt is supposed to only take 100 mg in am and at night. Pt denies any complaints. States this was accidental.

## 2023-02-04 NOTE — ED Provider Notes (Signed)
[place] EMERGENCY DEPARTMENT AT [facility] Provider Note   CSN: [id number] Arrival date & time: 02/04/23  1810     History  Chief Complaint  Patient presents with   Drug Overdose    Laura RINIKER is a 82 y.o. female.   Drug Overdose   This patient is an 51 female history of hypertension tension on amlodipine, metoprolol, she has a history of anxiety on Zoloft, she was previously taken 100 mg twice a day, she was told by her doctor on Friday to start taking 200 mg twice a day, it is now 5 days later and she had not increased her dose however today she accidentally took 200 mg in the morning and 200 mg at night, she did this by taking tomorrow's medications as well as today, the pharmacist when called was told her that she was not supposed to take that much medication as it could cause a lot of serotonin in her bloodstream.  The daughter who brings her to the hospital states that she appears to be her normal self without complaint.  The patient does not denies any diaphoresis, nausea, vomiting, she has her normal level of anxiety, there is been no fevers chills diarrhea coughing shortness of breath chest pain or any other complaints other than a mild chronic headache.  The last dose of Zoloft was about 1 hour ago, again it was 200 mg    Home Medications Prior to Admission medications   Medication Sig Start Date End Date Taking? Authorizing Provider  amLODipine (NORVASC) 2.5 MG tablet Take 2.5 mg by mouth daily.      [provider]  aspirin 81 MG tablet Take 81 mg by mouth daily.    [provider]  atenolol (TENORMIN) 50 MG tablet Take 50 mg by mouth daily.      [provider]  atorvastatin (LIPITOR) 10 MG tablet Take 10 mg by mouth daily.      [provider]  benzonatate (TESSALON) 100 MG capsule Take 1 capsule (100 mg total) by mouth every 8 (eight) hours. 06/23/19   Gailen Shelter, PA  buPROPion (WELLBUTRIN XL) 300 MG 24 hr tablet  Take 300 mg by mouth daily.      [provider]  fluticasone (CUTIVATE) 0.05 % cream  08/10/13   [provider]  levothyroxine (SYNTHROID, LEVOTHROID) 125 MCG tablet Take 125 mcg by mouth daily.      [provider]  losartan-hydrochlorothiazide (HYZAAR) 50-12.5 MG per tablet Daily. 12/16/11   [provider]  nitroGLYCERIN (NITROSTAT) 0.4 MG SL tablet Place 0.4 mg under the tongue every 5 (five) minutes as needed.      [provider]  omeprazole (PRILOSEC) 20 MG capsule Take 20 mg by mouth daily.      [provider]  ondansetron (ZOFRAN-ODT) 8 MG disintegrating tablet Take 1 tablet (8 mg total) by mouth every 8 (eight) hours as needed for nausea or vomiting. 10/29/22   Cathren Laine, MD  OXYCONTIN 10 MG 12 hr tablet 5 mg.  01/10/12   [provider]  potassium chloride SA (KLOR-CON M) 20 MEQ tablet One po bid x 3 days, then one po once a day 10/29/22   Cathren Laine, MD  pravastatin (PRAVACHOL) 20 MG tablet  09/20/13   [provider]  sertraline (ZOLOFT) 100 MG tablet Take 100 mg by mouth daily.      [provider]  traZODone (DESYREL) 100 MG tablet Take 100 mg  by mouth at bedtime.     [provider]  triamcinolone (KENALOG) 0.025 % cream  08/30/13   [provider]      Allergies    Morphine and codeine, Crestor [rosuvastatin], and Cephalexin    Review of Systems   Review of Systems  All other systems reviewed and are negative.   Physical Exam Updated Vital Signs BP (!) 193/73   Pulse 62   Temp 98.1 F (36.7 C) (Oral)   Resp 16   Ht 1.651 m (5\' 5" )   Wt 66 kg   SpO2 95%   BMI 24.21 kg/m  Physical Exam Vitals and nursing note reviewed.  Constitutional:      General: She is not in acute distress.    Appearance: She is well-developed.  HENT:     Head: Normocephalic and atraumatic.     Mouth/Throat:     Pharynx: No oropharyngeal exudate.  Eyes:     General: No scleral icterus.        Right eye: No discharge.        Left eye: No discharge.     Conjunctiva/sclera: Conjunctivae normal.     Pupils: Pupils are equal, round, and reactive to light.  Neck:     Thyroid: No thyromegaly.     Vascular: No JVD.  Cardiovascular:     Rate and Rhythm: Normal rate and regular rhythm.     Heart sounds: Normal heart sounds. No murmur heard.    No friction rub. No gallop.  Pulmonary:     Effort: Pulmonary effort is normal. No respiratory distress.     Breath sounds: Normal breath sounds. No wheezing or rales.  Abdominal:     General: Bowel sounds are normal. There is no distension.     Palpations: Abdomen is soft. There is no mass.     Tenderness: There is no abdominal tenderness.  Musculoskeletal:        General: No tenderness. Normal range of motion.     Cervical back: Normal range of motion and neck supple.     Right lower leg: No edema.     Left lower leg: No edema.  Lymphadenopathy:     Cervical: No cervical adenopathy.  Skin:    General: Skin is warm and dry.     Findings: No erythema or rash.  Neurological:     Mental Status: She is alert.     Coordination: Coordination normal.  Psychiatric:        Behavior: Behavior normal.     ED Results / Procedures / Treatments   Labs (all labs ordered are listed, but only abnormal results are displayed) Labs Reviewed  CBC WITH DIFFERENTIAL/PLATELET - Abnormal; Notable for the following components:      Result Value   Hemoglobin 9.8 (*)    HCT 31.4 (*)    MCH 25.1 (*)    RDW 20.0 (*)    Platelets 115 (*)    All other components within normal limits  COMPREHENSIVE METABOLIC PANEL - Abnormal; Notable for the following components:   Sodium 134 (*)    Glucose, Bld 127 (*)    Creatinine, Ser 1.29 (*)    Total Protein 6.3 (*)    GFR, Estimated 41 (*)    All other components within normal limits    EKG EKG Interpretation Date/Time:  Tuesday February 04 2023 18:35:13 EDT Ventricular Rate:  63 PR Interval:  276 QRS  Duration:  117 QT Interval:  506 QTC Calculation: 518  R Axis:   -29  Text Interpretation: Sinus rhythm Ventricular trigeminy Prolonged PR interval LVH with IVCD and secondary repol abnrm Anterior ST elevation, probably due to LVH Prolonged QT interval Since last tracing rate slower Confirmed by Eber Hong ([id number]) on 02/04/2023 6:42:07 PM  Radiology No results found.  Procedures Procedures    Medications Ordered in ED Medications - No data to display  ED Course/ Medical Decision Making/ A&P Clinical Course as of 02/04/23 2114  Tue Feb 04, 2023  1930 Laboratory workup was reviewed, the patient has mild thrombocytopenia, this is not a new value, this has been seen on prior labs.  The metabolic panel shows no acute findings other than minimal hyponatremia which is of no consequence.  Creatinine is 1.29, BUN is 23 [BM]    Clinical Course User Index [BM] Eber Hong, MD                                 Medical Decision Making Amount and/or Complexity of Data Reviewed Labs: ordered.    This patient presents to the ED for concern of overdose, this involves an extensive number of treatment options, and is a complaint that carries with it a high risk of complications and morbidity.  The differential diagnosis includes serotonin syndrome, benign overdose, this was completely unintentional   Co morbidities that complicate the patient evaluation  Chronic anxiety   Additional history obtained:  Additional history obtained from medical record and the patient's daughter External records from outside source obtained and reviewed including goes to outpatient physical therapy for chronic neck pain, followed in the pain control clinic, no recent admissions to the hospital   Lab Tests:  I Ordered, and personally interpreted labs.  The pertinent results include:   CBC/CMP - unremarkable overall unremarkable   Imaging Studies ordered:  Not indicated   Cardiac Monitoring: /  EKG:  The patient was maintained on a cardiac monitor.  I personally viewed and interpreted the cardiac monitored which showed an underlying rhythm of: normal sinus rhuthm   Consultations Obtained:  I requested consultation with the poison control center,  and discussed lab and imaging findings as well as pertinent plan - they recommend: observe short period of time for sleepiness, tachycardia -  Observe 4 hours from time of ingestion -  EKG, labs, home if asymptomatic after 4 hours   Problem List / ED Course / Critical interventions / Medication management  The patient refused to last the whole 4 hours for her evaluation and observation.  At around 9:00 PM the patient decided to leave AGAINST MEDICAL ADVICE.  She refused to stay any longer, she was asymptomatic at the time of leaving AGAINST MEDICAL ADVICE, she appears to have a medical decision making capacity I have reviewed the patients home medicines and have made adjustments as needed   Social Determinants of Health:  Elderly   Test / Admission - Considered:  Patient did not complete the observation period, appeared to have her decision-making capacity at the time of discharge AGAINST MEDICAL ADVICE         Final Clinical Impression(s) / ED Diagnoses Final diagnoses:  Overdose of antidepressant, accidental or unintentional, initial encounter     Eber Hong, MD 02/04/23 2114

## 2023-02-05 DIAGNOSIS — T43501S Poisoning by unspecified antipsychotics and neuroleptics, accidental (unintentional), sequela: Secondary | ICD-10-CM | POA: Diagnosis not present

## 2023-02-12 DIAGNOSIS — Z79891 Long term (current) use of opiate analgesic: Secondary | ICD-10-CM | POA: Diagnosis not present

## 2023-02-12 DIAGNOSIS — M47812 Spondylosis without myelopathy or radiculopathy, cervical region: Secondary | ICD-10-CM | POA: Diagnosis not present

## 2023-02-18 ENCOUNTER — Other Ambulatory Visit: Payer: Self-pay | Admitting: Advanced Practice Midwife

## 2023-02-18 DIAGNOSIS — I129 Hypertensive chronic kidney disease with stage 1 through stage 4 chronic kidney disease, or unspecified chronic kidney disease: Secondary | ICD-10-CM | POA: Diagnosis not present

## 2023-02-18 DIAGNOSIS — E1122 Type 2 diabetes mellitus with diabetic chronic kidney disease: Secondary | ICD-10-CM | POA: Diagnosis not present

## 2023-02-18 DIAGNOSIS — D631 Anemia in chronic kidney disease: Secondary | ICD-10-CM | POA: Diagnosis not present

## 2023-02-18 DIAGNOSIS — N1832 Chronic kidney disease, stage 3b: Secondary | ICD-10-CM | POA: Diagnosis not present

## 2023-03-12 DIAGNOSIS — M47812 Spondylosis without myelopathy or radiculopathy, cervical region: Secondary | ICD-10-CM | POA: Diagnosis not present

## 2023-03-17 DIAGNOSIS — L218 Other seborrheic dermatitis: Secondary | ICD-10-CM | POA: Diagnosis not present

## 2023-03-17 DIAGNOSIS — C44319 Basal cell carcinoma of skin of other parts of face: Secondary | ICD-10-CM | POA: Diagnosis not present

## 2023-03-25 DIAGNOSIS — G9520 Unspecified cord compression: Secondary | ICD-10-CM | POA: Diagnosis not present

## 2023-03-25 DIAGNOSIS — D649 Anemia, unspecified: Secondary | ICD-10-CM | POA: Diagnosis not present

## 2023-03-25 DIAGNOSIS — F339 Major depressive disorder, recurrent, unspecified: Secondary | ICD-10-CM | POA: Diagnosis not present

## 2023-03-25 DIAGNOSIS — I129 Hypertensive chronic kidney disease with stage 1 through stage 4 chronic kidney disease, or unspecified chronic kidney disease: Secondary | ICD-10-CM | POA: Diagnosis not present

## 2023-03-28 ENCOUNTER — Emergency Department (HOSPITAL_BASED_OUTPATIENT_CLINIC_OR_DEPARTMENT_OTHER): Payer: Medicare Other

## 2023-03-28 ENCOUNTER — Emergency Department (HOSPITAL_BASED_OUTPATIENT_CLINIC_OR_DEPARTMENT_OTHER)
Admission: EM | Admit: 2023-03-28 | Discharge: 2023-03-29 | Disposition: A | Discharge status: Home / Self Care | Payer: Medicare Other | Attending: Emergency Medicine | Admitting: Emergency Medicine

## 2023-03-28 ENCOUNTER — Encounter: Payer: Self-pay | Admitting: Cardiovascular Disease

## 2023-03-28 ENCOUNTER — Other Ambulatory Visit: Payer: Self-pay

## 2023-03-28 ENCOUNTER — Encounter (HOSPITAL_BASED_OUTPATIENT_CLINIC_OR_DEPARTMENT_OTHER): Payer: Self-pay | Admitting: Emergency Medicine

## 2023-03-28 DIAGNOSIS — Z993 Dependence on wheelchair: Secondary | ICD-10-CM | POA: Diagnosis not present

## 2023-03-28 DIAGNOSIS — D692 Other nonthrombocytopenic purpura: Secondary | ICD-10-CM | POA: Diagnosis not present

## 2023-03-28 DIAGNOSIS — Z556 Problems related to health literacy: Secondary | ICD-10-CM | POA: Diagnosis not present

## 2023-03-28 DIAGNOSIS — Z7982 Long term (current) use of aspirin: Secondary | ICD-10-CM | POA: Insufficient documentation

## 2023-03-28 DIAGNOSIS — Z853 Personal history of malignant neoplasm of breast: Secondary | ICD-10-CM | POA: Insufficient documentation

## 2023-03-28 DIAGNOSIS — N184 Chronic kidney disease, stage 4 (severe): Secondary | ICD-10-CM | POA: Diagnosis not present

## 2023-03-28 DIAGNOSIS — E1122 Type 2 diabetes mellitus with diabetic chronic kidney disease: Secondary | ICD-10-CM | POA: Diagnosis not present

## 2023-03-28 DIAGNOSIS — E782 Mixed hyperlipidemia: Secondary | ICD-10-CM | POA: Diagnosis not present

## 2023-03-28 DIAGNOSIS — I6523 Occlusion and stenosis of bilateral carotid arteries: Secondary | ICD-10-CM | POA: Diagnosis not present

## 2023-03-28 DIAGNOSIS — Z79899 Other long term (current) drug therapy: Secondary | ICD-10-CM | POA: Diagnosis not present

## 2023-03-28 DIAGNOSIS — F3342 Major depressive disorder, recurrent, in full remission: Secondary | ICD-10-CM | POA: Diagnosis not present

## 2023-03-28 DIAGNOSIS — I6782 Cerebral ischemia: Secondary | ICD-10-CM | POA: Diagnosis not present

## 2023-03-28 DIAGNOSIS — I130 Hypertensive heart and chronic kidney disease with heart failure and stage 1 through stage 4 chronic kidney disease, or unspecified chronic kidney disease: Secondary | ICD-10-CM | POA: Diagnosis not present

## 2023-03-28 DIAGNOSIS — R519 Headache, unspecified: Secondary | ICD-10-CM | POA: Diagnosis not present

## 2023-03-28 DIAGNOSIS — R296 Repeated falls: Secondary | ICD-10-CM | POA: Insufficient documentation

## 2023-03-28 DIAGNOSIS — D631 Anemia in chronic kidney disease: Secondary | ICD-10-CM | POA: Diagnosis not present

## 2023-03-28 DIAGNOSIS — I422 Other hypertrophic cardiomyopathy: Secondary | ICD-10-CM | POA: Diagnosis not present

## 2023-03-28 DIAGNOSIS — M4802 Spinal stenosis, cervical region: Secondary | ICD-10-CM | POA: Diagnosis not present

## 2023-03-28 DIAGNOSIS — E2839 Other primary ovarian failure: Secondary | ICD-10-CM | POA: Diagnosis not present

## 2023-03-28 DIAGNOSIS — K219 Gastro-esophageal reflux disease without esophagitis: Secondary | ICD-10-CM | POA: Diagnosis not present

## 2023-03-28 DIAGNOSIS — I509 Heart failure, unspecified: Secondary | ICD-10-CM | POA: Diagnosis not present

## 2023-03-28 DIAGNOSIS — E039 Hypothyroidism, unspecified: Secondary | ICD-10-CM | POA: Diagnosis not present

## 2023-03-28 DIAGNOSIS — R001 Bradycardia, unspecified: Secondary | ICD-10-CM | POA: Diagnosis not present

## 2023-03-28 DIAGNOSIS — L84 Corns and callosities: Secondary | ICD-10-CM | POA: Diagnosis not present

## 2023-03-28 DIAGNOSIS — I10 Essential (primary) hypertension: Secondary | ICD-10-CM | POA: Insufficient documentation

## 2023-03-28 DIAGNOSIS — M47812 Spondylosis without myelopathy or radiculopathy, cervical region: Secondary | ICD-10-CM | POA: Diagnosis not present

## 2023-03-28 DIAGNOSIS — F419 Anxiety disorder, unspecified: Secondary | ICD-10-CM | POA: Diagnosis not present

## 2023-03-28 DIAGNOSIS — M150 Primary generalized (osteo)arthritis: Secondary | ICD-10-CM | POA: Diagnosis not present

## 2023-03-28 DIAGNOSIS — Z9181 History of falling: Secondary | ICD-10-CM | POA: Diagnosis not present

## 2023-03-28 DIAGNOSIS — G9520 Unspecified cord compression: Secondary | ICD-10-CM | POA: Diagnosis not present

## 2023-03-28 LAB — BASIC METABOLIC PANEL
Anion gap: 9 (ref 5–15)
BUN: 32 mg/dL — ABNORMAL HIGH (ref 8–23)
CO2: 25 mmol/L (ref 22–32)
Calcium: 9.5 mg/dL (ref 8.9–10.3)
Chloride: 107 mmol/L (ref 98–111)
Creatinine, Ser: 1.43 mg/dL — ABNORMAL HIGH (ref 0.44–1.00)
GFR, Estimated: 36 mL/min — ABNORMAL LOW (ref >60)
Glucose, Bld: 97 mg/dL (ref 70–99)
Potassium: 4 mmol/L (ref 3.5–5.1)
Sodium: 141 mmol/L (ref 135–145)

## 2023-03-28 LAB — URINALYSIS, ROUTINE W REFLEX MICROSCOPIC
Bacteria, UA: NONE SEEN
Bilirubin Urine: NEGATIVE
Glucose, UA: NEGATIVE mg/dL
Ketones, ur: NEGATIVE mg/dL
Nitrite: NEGATIVE
Specific Gravity, Urine: 1.009 (ref 1.005–1.030)
pH: 6 (ref 5.0–8.0)

## 2023-03-28 LAB — CBC
HCT: 34.7 % — ABNORMAL LOW (ref 36.0–46.0)
Hemoglobin: 11.5 g/dL — ABNORMAL LOW (ref 12.0–15.0)
MCH: 27.1 pg (ref 26.0–34.0)
MCHC: 33.1 g/dL (ref 30.0–36.0)
MCV: 81.8 fL (ref 80.0–100.0)
Platelets: 95 10*3/uL — ABNORMAL LOW (ref 150–400)
RBC: 4.24 MIL/uL (ref 3.87–5.11)
RDW: 16.1 % — ABNORMAL HIGH (ref 11.5–15.5)
WBC: 5.7 10*3/uL (ref 4.0–10.5)
nRBC: 0 % (ref 0.0–0.2)

## 2023-03-28 MED ORDER — AMLODIPINE BESYLATE 5 MG PO TABS: 5.0000 mg | ORAL_TABLET | Freq: Every day | ORAL | 0 refills | Status: AC

## 2023-03-28 MED ORDER — AMLODIPINE BESYLATE 5 MG PO TABS
5.0000 mg | ORAL_TABLET | Freq: Once | ORAL | Status: AC
  Administered 2023-03-28: 5 mg via ORAL
  Filled 2023-03-28: qty 1

## 2023-03-28 NOTE — ED Triage Notes (Signed)
Pt endorses frequent falls, c/o HTN. B/p 224/94 in triage. Endorses HA, denies CP. Tx by EMS pta

## 2023-03-28 NOTE — ED Provider Notes (Signed)
[place] EMERGENCY DEPARTMENT AT [facility] Provider Note   CSN: [id number] Arrival date & time: 03/28/23  1813     History  Chief Complaint  Patient presents with   Hypertension    Laura Cruz is a 82 y.o. female.  She is brought in by her daughters for evaluation of elevated blood pressure.  She has a history of breast cancer hypertension.  She has had multiple falls over the course of the last few months.  She saw her primary care doctor and had her first visit by physical therapy today.  They checked her blood pressure and found it to be significantly elevated.  Patient denies any significant complaints although she does endorse a headache it has been on and off for a while.  No numbness or weakness no chest pain or shortness of breath.  No blurry vision double vision.  She said she did take her blood pressure medicine today she is on metoprolol once a day.  She uses a walker for ambulation  The history is provided by the patient.  Hypertension This is a new problem. The problem occurs constantly. The problem has not changed since onset.Associated symptoms include headaches. Pertinent negatives include no chest pain, no abdominal pain and no shortness of breath. Nothing aggravates the symptoms. Nothing relieves the symptoms. She has tried nothing for the symptoms. The treatment provided no relief.       Home Medications Prior to Admission medications   Medication Sig Start Date End Date Taking? Authorizing Provider  amLODipine (NORVASC) 2.5 MG tablet Take 2.5 mg by mouth daily.      [provider]  aspirin 81 MG tablet Take 81 mg by mouth daily.    [provider]  atenolol (TENORMIN) 50 MG tablet Take 50 mg by mouth daily.      [provider]  atorvastatin (LIPITOR) 10 MG tablet Take 10 mg by mouth daily.      [provider]  benzonatate (TESSALON) 100 MG capsule Take 1 capsule (100 mg total) by mouth every 8 (eight) hours.  06/23/19   Gailen Shelter, PA  buPROPion (WELLBUTRIN XL) 300 MG 24 hr tablet Take 300 mg by mouth daily.      [provider]  fluticasone (CUTIVATE) 0.05 % cream  08/10/13   [provider]  levothyroxine (SYNTHROID, LEVOTHROID) 125 MCG tablet Take 125 mcg by mouth daily.      [provider]  losartan-hydrochlorothiazide (HYZAAR) 50-12.5 MG per tablet Daily. 12/16/11   [provider]  nitroGLYCERIN (NITROSTAT) 0.4 MG SL tablet Place 0.4 mg under the tongue every 5 (five) minutes as needed.      [provider]  omeprazole (PRILOSEC) 20 MG capsule Take 20 mg by mouth daily.      [provider]  ondansetron (ZOFRAN-ODT) 8 MG disintegrating tablet Take 1 tablet (8 mg total) by mouth every 8 (eight) hours as needed for nausea or vomiting. 10/29/22   Cathren Laine, MD  OXYCONTIN 10 MG 12 hr tablet 5 mg.  01/10/12   [provider]  potassium chloride SA (KLOR-CON M) 20 MEQ tablet One po bid x 3 days, then one po once a day 10/29/22   Cathren Laine, MD  pravastatin (PRAVACHOL) 20 MG tablet  09/20/13   [provider]  sertraline (ZOLOFT) 100 MG tablet Take 100 mg by mouth daily.      [provider]  traZODone (DESYREL) 100 MG tablet Take 100 mg by  mouth at bedtime.     [provider]  triamcinolone (KENALOG) 0.025 % cream  08/30/13   [provider]      Allergies    Morphine and codeine, Crestor [rosuvastatin], and Cephalexin    Review of Systems   Review of Systems  Constitutional:  Negative for fever.  Eyes:  Negative for visual disturbance.  Respiratory:  Negative for shortness of breath.   Cardiovascular:  Negative for chest pain.  Gastrointestinal:  Negative for abdominal pain.  Genitourinary:  Negative for dysuria.  Neurological:  Positive for headaches. Negative for weakness and numbness.    Physical Exam Updated Vital Signs BP (!) 189/149   Pulse 71   Temp 98.2 F (36.8 C)   Resp 18    Wt 61.7 kg   SpO2 97%   BMI 22.63 kg/m  Physical Exam Vitals and nursing note reviewed.  Constitutional:      General: She is not in acute distress.    Appearance: Normal appearance. She is well-developed.  HENT:     Head: Normocephalic and atraumatic.  Eyes:     Conjunctiva/sclera: Conjunctivae normal.  Cardiovascular:     Rate and Rhythm: Normal rate and regular rhythm.     Heart sounds: No murmur heard. Pulmonary:     Effort: Pulmonary effort is normal. No respiratory distress.     Breath sounds: Normal breath sounds.  Abdominal:     Palpations: Abdomen is soft.     Tenderness: There is no abdominal tenderness. There is no guarding or rebound.  Musculoskeletal:        General: No deformity.     Cervical back: Neck supple.  Skin:    General: Skin is warm and dry.     Capillary Refill: Capillary refill takes less than 2 seconds.  Neurological:     General: No focal deficit present.     Mental Status: She is alert.     Cranial Nerves: No cranial nerve deficit.     Sensory: No sensory deficit.     Motor: No weakness.     ED Results / Procedures / Treatments   Labs (all labs ordered are listed, but only abnormal results are displayed) Labs Reviewed  BASIC METABOLIC PANEL - Abnormal; Notable for the following components:      Result Value   BUN 32 (*)    Creatinine, Ser 1.43 (*)    GFR, Estimated 36 (*)    All other components within normal limits  CBC - Abnormal; Notable for the following components:   Hemoglobin 11.5 (*)    HCT 34.7 (*)    RDW 16.1 (*)    Platelets 95 (*)    All other components within normal limits  URINALYSIS, ROUTINE W REFLEX MICROSCOPIC - Abnormal; Notable for the following components:   Color, Urine COLORLESS (*)    Hgb urine dipstick MODERATE (*)    Protein, ur TRACE (*)    Leukocytes,Ua SMALL (*)    All other components within normal limits    EKG EKG Interpretation Date/Time:  Friday March 28 2023 18:41:51 EDT Ventricular  Rate:  67 PR Interval:  272 QRS Duration:  104 QT Interval:  446 QTC Calculation: 471 R Axis:   -11  Text Interpretation: Sinus rhythm with 1st degree A-V block Left ventricular hypertrophy with repolarization abnormality ( R in aVL , Cornell product , Romhilt-Estes ) Anterior infarct , age undetermined Abnormal ECG When compared with ECG of 04-Feb-2023 18:35, less ectopy now Confirmed by  Meridee Score ([id number]) on 03/28/2023 6:45:33 PM  Radiology CT Head Wo Contrast  Result Date: 03/28/2023 CLINICAL DATA:  Frequent falls with hypertension and headache. EXAM: CT HEAD WITHOUT CONTRAST TECHNIQUE: Contiguous axial images were obtained from the base of the skull through the vertex without intravenous contrast. RADIATION DOSE REDUCTION: This exam was performed according to the departmental dose-optimization program which includes automated exposure control, adjustment of the mA and/or kV according to patient size and/or use of iterative reconstruction technique. COMPARISON:  Oct 20, 2006 FINDINGS: Brain: There is mild to moderate severity cerebral atrophy with widening of the extra-axial spaces and ventricular dilatation. There are areas of decreased attenuation within the white matter tracts of the supratentorial brain, consistent with microvascular disease changes. Vascular: There is marked severity bilateral cavernous carotid artery calcification. Skull: Normal. Negative for fracture or focal lesion. Sinuses/Orbits: No acute finding. Other: None. IMPRESSION: 1. Generalized cerebral atrophy with chronic white matter small vessel ischemic changes. 2. No acute intracranial abnormality. Electronically Signed   By: Aram Candela M.D.   On: 03/28/2023 23:50    Procedures Procedures    Medications Ordered in ED Medications  amLODipine (NORVASC) tablet 5 mg (5 mg Oral Given 03/28/23 2203)    ED Course/ Medical Decision Making/ A&P                                 Medical Decision Making Amount  and/or Complexity of Data Reviewed Labs: ordered. Radiology: ordered.  Risk Prescription drug management.   This patient complains of elevated blood pressure frequent falls headache; this involves an extensive number of treatment Options and is a complaint that carries with it a high risk of complications and morbidity. The differential includes hypertension, hypertensive emergency, stroke, bleed, renal impairment  I ordered, reviewed and interpreted labs, which included CBC with chronic anemia chronic thrombocytopenia, chemistries with elevated creatinine similar for the last 5 months, urinalysis without clear signs of infection I ordered medication oral blood pressure medication and reviewed PMP when indicated. I ordered imaging studies which included head CT and I independently    visualized and interpreted imaging which showed no acute findings Additional history obtained from patient's daughters Previous records obtained and reviewed in epic including recent PCP note  Cardiac monitoring reviewed, sinus rhythm Social determinants considered, no significant barriers Critical Interventions: None  After the interventions stated above, I reevaluated the patient and found patient's blood pressure to be elevated although patient fairly asymptomatic Admission and further testing considered, her care is signed out to Dr. Adela Lank to follow-up on results of CT.  She likely can be discharged on a higher dose of blood pressure medication with recommended close follow-up with PCP.  She likely will need further titration of her medication.         Final Clinical Impression(s) / ED Diagnoses Final diagnoses:  Uncontrolled hypertension  Frequent falls    Rx / DC Orders ED Discharge Orders          Ordered    amLODipine (NORVASC) 5 MG tablet  Daily        03/28/23 2328              Terrilee Files, MD 03/29/23 1003

## 2023-03-28 NOTE — Discharge Instructions (Addendum)
You were seen in the emergency department for headache and worsening blood pressure.  Your lab work was stable and your CAT scan of your head did not show any obvious findings.  We are increasing your amlodipine to 5 mg daily.  A new prescription was sent to your pharmacy.  Follow-up with your primary care doctor.   Thank you for the opportunity to take care of you in our Emergency Department. You have been diagnosed with high blood pressure, also known as hypertension. This means that the force of blood against the walls of your blood vessels called is too strong. It also means that your heart has to work harder to move the blood. High blood pressure usually has no symptoms, but over time, it can cause serious health problems such as Heart attack and heart failure Stroke Kidney disease and failure Vision loss With the help from your healthcare provider and some important life style changes, you can manage your blood pressure and protect your health. Please read the instructions provided on hypertension, how to manage it and how to check your blood pressure. Additionally, use the blood pressure log provided to record your blood pressures. Take the blood pressure log with you to your primary care doctor so that they can adjust your blood pressure medications if needed. Please read the instructions on follow-up appointment. Return to the ER or Call 911 right away if you have any of these symptoms: Chest pain or shortness of breath Severe headache Weakness, tingling, or numbness of your face, arms, or legs (especially on 1 side of the body) Sudden change in vision Confusion, trouble speaking, or trouble understanding speech

## 2023-03-29 DIAGNOSIS — E782 Mixed hyperlipidemia: Secondary | ICD-10-CM | POA: Diagnosis not present

## 2023-03-29 MED ORDER — HYDROCODONE-ACETAMINOPHEN 5-325 MG PO TABS: 1.0000 | ORAL_TABLET | Freq: Once | ORAL | Status: DC

## 2023-03-29 NOTE — ED Provider Notes (Signed)
Received patient in turnover from Dr. Charm Barges.  Please see their note for further details of Hx, PE.  Briefly patient is a 82 y.o. female with a Hypertension .  Patient has been suffering from right-sided headaches for some time.  She thinks it stems from her neck.  Awaiting CT imaging.  CT of the head is negative I discussed the results with the patient and the family.  They are still a bit worried about her blood pressure.  I had a long discussion with them about the blood pressure.  Encouraged them to follow-up with her doctor.       Melene Plan, DO 03/29/23 [phone]

## 2023-03-31 ENCOUNTER — Encounter: Payer: Self-pay | Admitting: Cardiovascular Disease

## 2023-04-01 DIAGNOSIS — D631 Anemia in chronic kidney disease: Secondary | ICD-10-CM | POA: Diagnosis not present

## 2023-04-01 DIAGNOSIS — D692 Other nonthrombocytopenic purpura: Secondary | ICD-10-CM | POA: Diagnosis not present

## 2023-04-01 DIAGNOSIS — E1122 Type 2 diabetes mellitus with diabetic chronic kidney disease: Secondary | ICD-10-CM | POA: Diagnosis not present

## 2023-04-01 DIAGNOSIS — I422 Other hypertrophic cardiomyopathy: Secondary | ICD-10-CM | POA: Diagnosis not present

## 2023-04-01 DIAGNOSIS — G9520 Unspecified cord compression: Secondary | ICD-10-CM | POA: Diagnosis not present

## 2023-04-01 DIAGNOSIS — E039 Hypothyroidism, unspecified: Secondary | ICD-10-CM | POA: Diagnosis not present

## 2023-04-01 DIAGNOSIS — I130 Hypertensive heart and chronic kidney disease with heart failure and stage 1 through stage 4 chronic kidney disease, or unspecified chronic kidney disease: Secondary | ICD-10-CM | POA: Diagnosis not present

## 2023-04-01 DIAGNOSIS — I509 Heart failure, unspecified: Secondary | ICD-10-CM | POA: Diagnosis not present

## 2023-04-01 DIAGNOSIS — F419 Anxiety disorder, unspecified: Secondary | ICD-10-CM | POA: Diagnosis not present

## 2023-04-01 DIAGNOSIS — M47812 Spondylosis without myelopathy or radiculopathy, cervical region: Secondary | ICD-10-CM | POA: Diagnosis not present

## 2023-04-01 DIAGNOSIS — N184 Chronic kidney disease, stage 4 (severe): Secondary | ICD-10-CM | POA: Diagnosis not present

## 2023-04-01 DIAGNOSIS — F3342 Major depressive disorder, recurrent, in full remission: Secondary | ICD-10-CM | POA: Diagnosis not present

## 2023-04-01 DIAGNOSIS — K219 Gastro-esophageal reflux disease without esophagitis: Secondary | ICD-10-CM | POA: Diagnosis not present

## 2023-04-01 DIAGNOSIS — E2839 Other primary ovarian failure: Secondary | ICD-10-CM | POA: Diagnosis not present

## 2023-04-01 DIAGNOSIS — M4802 Spinal stenosis, cervical region: Secondary | ICD-10-CM | POA: Diagnosis not present

## 2023-04-01 DIAGNOSIS — M150 Primary generalized (osteo)arthritis: Secondary | ICD-10-CM | POA: Diagnosis not present

## 2023-04-02 DIAGNOSIS — E2839 Other primary ovarian failure: Secondary | ICD-10-CM | POA: Diagnosis not present

## 2023-04-02 DIAGNOSIS — D631 Anemia in chronic kidney disease: Secondary | ICD-10-CM | POA: Diagnosis not present

## 2023-04-02 DIAGNOSIS — I509 Heart failure, unspecified: Secondary | ICD-10-CM | POA: Diagnosis not present

## 2023-04-02 DIAGNOSIS — F3342 Major depressive disorder, recurrent, in full remission: Secondary | ICD-10-CM | POA: Diagnosis not present

## 2023-04-02 DIAGNOSIS — D692 Other nonthrombocytopenic purpura: Secondary | ICD-10-CM | POA: Diagnosis not present

## 2023-04-02 DIAGNOSIS — G9520 Unspecified cord compression: Secondary | ICD-10-CM | POA: Diagnosis not present

## 2023-04-02 DIAGNOSIS — E1122 Type 2 diabetes mellitus with diabetic chronic kidney disease: Secondary | ICD-10-CM | POA: Diagnosis not present

## 2023-04-02 DIAGNOSIS — M47812 Spondylosis without myelopathy or radiculopathy, cervical region: Secondary | ICD-10-CM | POA: Diagnosis not present

## 2023-04-02 DIAGNOSIS — M4802 Spinal stenosis, cervical region: Secondary | ICD-10-CM | POA: Diagnosis not present

## 2023-04-02 DIAGNOSIS — K219 Gastro-esophageal reflux disease without esophagitis: Secondary | ICD-10-CM | POA: Diagnosis not present

## 2023-04-02 DIAGNOSIS — M150 Primary generalized (osteo)arthritis: Secondary | ICD-10-CM | POA: Diagnosis not present

## 2023-04-02 DIAGNOSIS — F419 Anxiety disorder, unspecified: Secondary | ICD-10-CM | POA: Diagnosis not present

## 2023-04-02 DIAGNOSIS — E039 Hypothyroidism, unspecified: Secondary | ICD-10-CM | POA: Diagnosis not present

## 2023-04-02 DIAGNOSIS — I422 Other hypertrophic cardiomyopathy: Secondary | ICD-10-CM | POA: Diagnosis not present

## 2023-04-02 DIAGNOSIS — I130 Hypertensive heart and chronic kidney disease with heart failure and stage 1 through stage 4 chronic kidney disease, or unspecified chronic kidney disease: Secondary | ICD-10-CM | POA: Diagnosis not present

## 2023-04-02 DIAGNOSIS — N184 Chronic kidney disease, stage 4 (severe): Secondary | ICD-10-CM | POA: Diagnosis not present

## 2023-04-03 DIAGNOSIS — M150 Primary generalized (osteo)arthritis: Secondary | ICD-10-CM | POA: Diagnosis not present

## 2023-04-03 DIAGNOSIS — K219 Gastro-esophageal reflux disease without esophagitis: Secondary | ICD-10-CM | POA: Diagnosis not present

## 2023-04-03 DIAGNOSIS — E2839 Other primary ovarian failure: Secondary | ICD-10-CM | POA: Diagnosis not present

## 2023-04-03 DIAGNOSIS — E1122 Type 2 diabetes mellitus with diabetic chronic kidney disease: Secondary | ICD-10-CM | POA: Diagnosis not present

## 2023-04-03 DIAGNOSIS — N184 Chronic kidney disease, stage 4 (severe): Secondary | ICD-10-CM | POA: Diagnosis not present

## 2023-04-03 DIAGNOSIS — G9520 Unspecified cord compression: Secondary | ICD-10-CM | POA: Diagnosis not present

## 2023-04-03 DIAGNOSIS — D692 Other nonthrombocytopenic purpura: Secondary | ICD-10-CM | POA: Diagnosis not present

## 2023-04-03 DIAGNOSIS — D631 Anemia in chronic kidney disease: Secondary | ICD-10-CM | POA: Diagnosis not present

## 2023-04-03 DIAGNOSIS — M47812 Spondylosis without myelopathy or radiculopathy, cervical region: Secondary | ICD-10-CM | POA: Diagnosis not present

## 2023-04-03 DIAGNOSIS — F419 Anxiety disorder, unspecified: Secondary | ICD-10-CM | POA: Diagnosis not present

## 2023-04-03 DIAGNOSIS — M4802 Spinal stenosis, cervical region: Secondary | ICD-10-CM | POA: Diagnosis not present

## 2023-04-03 DIAGNOSIS — I509 Heart failure, unspecified: Secondary | ICD-10-CM | POA: Diagnosis not present

## 2023-04-03 DIAGNOSIS — E039 Hypothyroidism, unspecified: Secondary | ICD-10-CM | POA: Diagnosis not present

## 2023-04-03 DIAGNOSIS — F3342 Major depressive disorder, recurrent, in full remission: Secondary | ICD-10-CM | POA: Diagnosis not present

## 2023-04-03 DIAGNOSIS — I130 Hypertensive heart and chronic kidney disease with heart failure and stage 1 through stage 4 chronic kidney disease, or unspecified chronic kidney disease: Secondary | ICD-10-CM | POA: Diagnosis not present

## 2023-04-03 DIAGNOSIS — I422 Other hypertrophic cardiomyopathy: Secondary | ICD-10-CM | POA: Diagnosis not present

## 2023-04-04 DIAGNOSIS — I422 Other hypertrophic cardiomyopathy: Secondary | ICD-10-CM | POA: Diagnosis not present

## 2023-04-04 DIAGNOSIS — I10 Essential (primary) hypertension: Secondary | ICD-10-CM | POA: Diagnosis not present

## 2023-04-07 DIAGNOSIS — F419 Anxiety disorder, unspecified: Secondary | ICD-10-CM | POA: Diagnosis not present

## 2023-04-07 DIAGNOSIS — E2839 Other primary ovarian failure: Secondary | ICD-10-CM | POA: Diagnosis not present

## 2023-04-07 DIAGNOSIS — N184 Chronic kidney disease, stage 4 (severe): Secondary | ICD-10-CM | POA: Diagnosis not present

## 2023-04-07 DIAGNOSIS — D692 Other nonthrombocytopenic purpura: Secondary | ICD-10-CM | POA: Diagnosis not present

## 2023-04-07 DIAGNOSIS — I509 Heart failure, unspecified: Secondary | ICD-10-CM | POA: Diagnosis not present

## 2023-04-07 DIAGNOSIS — K219 Gastro-esophageal reflux disease without esophagitis: Secondary | ICD-10-CM | POA: Diagnosis not present

## 2023-04-07 DIAGNOSIS — D631 Anemia in chronic kidney disease: Secondary | ICD-10-CM | POA: Diagnosis not present

## 2023-04-07 DIAGNOSIS — F3342 Major depressive disorder, recurrent, in full remission: Secondary | ICD-10-CM | POA: Diagnosis not present

## 2023-04-07 DIAGNOSIS — G9520 Unspecified cord compression: Secondary | ICD-10-CM | POA: Diagnosis not present

## 2023-04-07 DIAGNOSIS — I422 Other hypertrophic cardiomyopathy: Secondary | ICD-10-CM | POA: Diagnosis not present

## 2023-04-07 DIAGNOSIS — E039 Hypothyroidism, unspecified: Secondary | ICD-10-CM | POA: Diagnosis not present

## 2023-04-07 DIAGNOSIS — I130 Hypertensive heart and chronic kidney disease with heart failure and stage 1 through stage 4 chronic kidney disease, or unspecified chronic kidney disease: Secondary | ICD-10-CM | POA: Diagnosis not present

## 2023-04-07 DIAGNOSIS — M47812 Spondylosis without myelopathy or radiculopathy, cervical region: Secondary | ICD-10-CM | POA: Diagnosis not present

## 2023-04-07 DIAGNOSIS — M150 Primary generalized (osteo)arthritis: Secondary | ICD-10-CM | POA: Diagnosis not present

## 2023-04-07 DIAGNOSIS — E1122 Type 2 diabetes mellitus with diabetic chronic kidney disease: Secondary | ICD-10-CM | POA: Diagnosis not present

## 2023-04-07 DIAGNOSIS — M4802 Spinal stenosis, cervical region: Secondary | ICD-10-CM | POA: Diagnosis not present

## 2023-04-10 ENCOUNTER — Ambulatory Visit: Payer: Medicare Other | Admitting: Cardiology

## 2023-04-15 DIAGNOSIS — G9520 Unspecified cord compression: Secondary | ICD-10-CM | POA: Diagnosis not present

## 2023-04-15 DIAGNOSIS — M4802 Spinal stenosis, cervical region: Secondary | ICD-10-CM | POA: Diagnosis not present

## 2023-04-15 DIAGNOSIS — I130 Hypertensive heart and chronic kidney disease with heart failure and stage 1 through stage 4 chronic kidney disease, or unspecified chronic kidney disease: Secondary | ICD-10-CM | POA: Diagnosis not present

## 2023-04-15 DIAGNOSIS — E2839 Other primary ovarian failure: Secondary | ICD-10-CM | POA: Diagnosis not present

## 2023-04-15 DIAGNOSIS — I509 Heart failure, unspecified: Secondary | ICD-10-CM | POA: Diagnosis not present

## 2023-04-15 DIAGNOSIS — K219 Gastro-esophageal reflux disease without esophagitis: Secondary | ICD-10-CM | POA: Diagnosis not present

## 2023-04-15 DIAGNOSIS — F3342 Major depressive disorder, recurrent, in full remission: Secondary | ICD-10-CM | POA: Diagnosis not present

## 2023-04-15 DIAGNOSIS — M47812 Spondylosis without myelopathy or radiculopathy, cervical region: Secondary | ICD-10-CM | POA: Diagnosis not present

## 2023-04-15 DIAGNOSIS — I422 Other hypertrophic cardiomyopathy: Secondary | ICD-10-CM | POA: Diagnosis not present

## 2023-04-15 DIAGNOSIS — E039 Hypothyroidism, unspecified: Secondary | ICD-10-CM | POA: Diagnosis not present

## 2023-04-15 DIAGNOSIS — D631 Anemia in chronic kidney disease: Secondary | ICD-10-CM | POA: Diagnosis not present

## 2023-04-15 DIAGNOSIS — F419 Anxiety disorder, unspecified: Secondary | ICD-10-CM | POA: Diagnosis not present

## 2023-04-15 DIAGNOSIS — M150 Primary generalized (osteo)arthritis: Secondary | ICD-10-CM | POA: Diagnosis not present

## 2023-04-15 DIAGNOSIS — E1122 Type 2 diabetes mellitus with diabetic chronic kidney disease: Secondary | ICD-10-CM | POA: Diagnosis not present

## 2023-04-15 DIAGNOSIS — D692 Other nonthrombocytopenic purpura: Secondary | ICD-10-CM | POA: Diagnosis not present

## 2023-04-15 DIAGNOSIS — N184 Chronic kidney disease, stage 4 (severe): Secondary | ICD-10-CM | POA: Diagnosis not present

## 2023-04-18 DIAGNOSIS — D649 Anemia, unspecified: Secondary | ICD-10-CM | POA: Diagnosis not present

## 2023-04-18 DIAGNOSIS — I10 Essential (primary) hypertension: Secondary | ICD-10-CM | POA: Diagnosis not present

## 2023-04-18 DIAGNOSIS — F329 Major depressive disorder, single episode, unspecified: Secondary | ICD-10-CM | POA: Diagnosis not present

## 2023-04-18 DIAGNOSIS — I422 Other hypertrophic cardiomyopathy: Secondary | ICD-10-CM | POA: Diagnosis not present

## 2023-04-18 DIAGNOSIS — Z79899 Other long term (current) drug therapy: Secondary | ICD-10-CM | POA: Diagnosis not present

## 2023-04-22 DIAGNOSIS — G9520 Unspecified cord compression: Secondary | ICD-10-CM | POA: Diagnosis not present

## 2023-04-22 DIAGNOSIS — I130 Hypertensive heart and chronic kidney disease with heart failure and stage 1 through stage 4 chronic kidney disease, or unspecified chronic kidney disease: Secondary | ICD-10-CM | POA: Diagnosis not present

## 2023-04-22 DIAGNOSIS — K219 Gastro-esophageal reflux disease without esophagitis: Secondary | ICD-10-CM | POA: Diagnosis not present

## 2023-04-22 DIAGNOSIS — F3342 Major depressive disorder, recurrent, in full remission: Secondary | ICD-10-CM | POA: Diagnosis not present

## 2023-04-22 DIAGNOSIS — F419 Anxiety disorder, unspecified: Secondary | ICD-10-CM | POA: Diagnosis not present

## 2023-04-22 DIAGNOSIS — M150 Primary generalized (osteo)arthritis: Secondary | ICD-10-CM | POA: Diagnosis not present

## 2023-04-22 DIAGNOSIS — M4802 Spinal stenosis, cervical region: Secondary | ICD-10-CM | POA: Diagnosis not present

## 2023-04-22 DIAGNOSIS — I509 Heart failure, unspecified: Secondary | ICD-10-CM | POA: Diagnosis not present

## 2023-04-22 DIAGNOSIS — N184 Chronic kidney disease, stage 4 (severe): Secondary | ICD-10-CM | POA: Diagnosis not present

## 2023-04-22 DIAGNOSIS — D692 Other nonthrombocytopenic purpura: Secondary | ICD-10-CM | POA: Diagnosis not present

## 2023-04-22 DIAGNOSIS — I422 Other hypertrophic cardiomyopathy: Secondary | ICD-10-CM | POA: Diagnosis not present

## 2023-04-22 DIAGNOSIS — M47812 Spondylosis without myelopathy or radiculopathy, cervical region: Secondary | ICD-10-CM | POA: Diagnosis not present

## 2023-04-22 DIAGNOSIS — E2839 Other primary ovarian failure: Secondary | ICD-10-CM | POA: Diagnosis not present

## 2023-04-22 DIAGNOSIS — E039 Hypothyroidism, unspecified: Secondary | ICD-10-CM | POA: Diagnosis not present

## 2023-04-22 DIAGNOSIS — D631 Anemia in chronic kidney disease: Secondary | ICD-10-CM | POA: Diagnosis not present

## 2023-04-22 DIAGNOSIS — E1122 Type 2 diabetes mellitus with diabetic chronic kidney disease: Secondary | ICD-10-CM | POA: Diagnosis not present

## 2023-04-28 DIAGNOSIS — M47812 Spondylosis without myelopathy or radiculopathy, cervical region: Secondary | ICD-10-CM | POA: Diagnosis not present

## 2023-04-28 DIAGNOSIS — Z853 Personal history of malignant neoplasm of breast: Secondary | ICD-10-CM | POA: Diagnosis not present

## 2023-04-28 DIAGNOSIS — D631 Anemia in chronic kidney disease: Secondary | ICD-10-CM | POA: Diagnosis not present

## 2023-04-28 DIAGNOSIS — I509 Heart failure, unspecified: Secondary | ICD-10-CM | POA: Diagnosis not present

## 2023-04-28 DIAGNOSIS — I130 Hypertensive heart and chronic kidney disease with heart failure and stage 1 through stage 4 chronic kidney disease, or unspecified chronic kidney disease: Secondary | ICD-10-CM | POA: Diagnosis not present

## 2023-04-28 DIAGNOSIS — E782 Mixed hyperlipidemia: Secondary | ICD-10-CM | POA: Diagnosis not present

## 2023-04-28 DIAGNOSIS — F419 Anxiety disorder, unspecified: Secondary | ICD-10-CM | POA: Diagnosis not present

## 2023-04-28 DIAGNOSIS — D692 Other nonthrombocytopenic purpura: Secondary | ICD-10-CM | POA: Diagnosis not present

## 2023-04-28 DIAGNOSIS — N184 Chronic kidney disease, stage 4 (severe): Secondary | ICD-10-CM | POA: Diagnosis not present

## 2023-04-28 DIAGNOSIS — X32XXXD Exposure to sunlight, subsequent encounter: Secondary | ICD-10-CM | POA: Diagnosis not present

## 2023-04-28 DIAGNOSIS — Z993 Dependence on wheelchair: Secondary | ICD-10-CM | POA: Diagnosis not present

## 2023-04-28 DIAGNOSIS — G9520 Unspecified cord compression: Secondary | ICD-10-CM | POA: Diagnosis not present

## 2023-04-28 DIAGNOSIS — L570 Actinic keratosis: Secondary | ICD-10-CM | POA: Diagnosis not present

## 2023-04-28 DIAGNOSIS — Z556 Problems related to health literacy: Secondary | ICD-10-CM | POA: Diagnosis not present

## 2023-04-28 DIAGNOSIS — I422 Other hypertrophic cardiomyopathy: Secondary | ICD-10-CM | POA: Diagnosis not present

## 2023-04-28 DIAGNOSIS — E2839 Other primary ovarian failure: Secondary | ICD-10-CM | POA: Diagnosis not present

## 2023-04-28 DIAGNOSIS — E1122 Type 2 diabetes mellitus with diabetic chronic kidney disease: Secondary | ICD-10-CM | POA: Diagnosis not present

## 2023-04-28 DIAGNOSIS — E039 Hypothyroidism, unspecified: Secondary | ICD-10-CM | POA: Diagnosis not present

## 2023-04-28 DIAGNOSIS — K219 Gastro-esophageal reflux disease without esophagitis: Secondary | ICD-10-CM | POA: Diagnosis not present

## 2023-04-28 DIAGNOSIS — M150 Primary generalized (osteo)arthritis: Secondary | ICD-10-CM | POA: Diagnosis not present

## 2023-04-28 DIAGNOSIS — L84 Corns and callosities: Secondary | ICD-10-CM | POA: Diagnosis not present

## 2023-04-28 DIAGNOSIS — Z08 Encounter for follow-up examination after completed treatment for malignant neoplasm: Secondary | ICD-10-CM | POA: Diagnosis not present

## 2023-04-28 DIAGNOSIS — M4802 Spinal stenosis, cervical region: Secondary | ICD-10-CM | POA: Diagnosis not present

## 2023-04-28 DIAGNOSIS — Z85828 Personal history of other malignant neoplasm of skin: Secondary | ICD-10-CM | POA: Diagnosis not present

## 2023-04-28 DIAGNOSIS — F3342 Major depressive disorder, recurrent, in full remission: Secondary | ICD-10-CM | POA: Diagnosis not present

## 2023-04-28 DIAGNOSIS — Z9181 History of falling: Secondary | ICD-10-CM | POA: Diagnosis not present

## 2023-05-05 DIAGNOSIS — N184 Chronic kidney disease, stage 4 (severe): Secondary | ICD-10-CM | POA: Diagnosis not present

## 2023-05-05 DIAGNOSIS — M47812 Spondylosis without myelopathy or radiculopathy, cervical region: Secondary | ICD-10-CM | POA: Diagnosis not present

## 2023-05-05 DIAGNOSIS — M4802 Spinal stenosis, cervical region: Secondary | ICD-10-CM | POA: Diagnosis not present

## 2023-05-05 DIAGNOSIS — F419 Anxiety disorder, unspecified: Secondary | ICD-10-CM | POA: Diagnosis not present

## 2023-05-05 DIAGNOSIS — K219 Gastro-esophageal reflux disease without esophagitis: Secondary | ICD-10-CM | POA: Diagnosis not present

## 2023-05-05 DIAGNOSIS — M150 Primary generalized (osteo)arthritis: Secondary | ICD-10-CM | POA: Diagnosis not present

## 2023-05-05 DIAGNOSIS — F3342 Major depressive disorder, recurrent, in full remission: Secondary | ICD-10-CM | POA: Diagnosis not present

## 2023-05-05 DIAGNOSIS — E1122 Type 2 diabetes mellitus with diabetic chronic kidney disease: Secondary | ICD-10-CM | POA: Diagnosis not present

## 2023-05-05 DIAGNOSIS — D631 Anemia in chronic kidney disease: Secondary | ICD-10-CM | POA: Diagnosis not present

## 2023-05-05 DIAGNOSIS — E2839 Other primary ovarian failure: Secondary | ICD-10-CM | POA: Diagnosis not present

## 2023-05-05 DIAGNOSIS — E039 Hypothyroidism, unspecified: Secondary | ICD-10-CM | POA: Diagnosis not present

## 2023-05-05 DIAGNOSIS — I422 Other hypertrophic cardiomyopathy: Secondary | ICD-10-CM | POA: Diagnosis not present

## 2023-05-05 DIAGNOSIS — I130 Hypertensive heart and chronic kidney disease with heart failure and stage 1 through stage 4 chronic kidney disease, or unspecified chronic kidney disease: Secondary | ICD-10-CM | POA: Diagnosis not present

## 2023-05-05 DIAGNOSIS — G9520 Unspecified cord compression: Secondary | ICD-10-CM | POA: Diagnosis not present

## 2023-05-05 DIAGNOSIS — D692 Other nonthrombocytopenic purpura: Secondary | ICD-10-CM | POA: Diagnosis not present

## 2023-05-05 DIAGNOSIS — I509 Heart failure, unspecified: Secondary | ICD-10-CM | POA: Diagnosis not present

## 2023-05-09 DIAGNOSIS — I422 Other hypertrophic cardiomyopathy: Secondary | ICD-10-CM | POA: Diagnosis not present

## 2023-05-09 DIAGNOSIS — E118 Type 2 diabetes mellitus with unspecified complications: Secondary | ICD-10-CM | POA: Diagnosis not present

## 2023-05-09 DIAGNOSIS — D696 Thrombocytopenia, unspecified: Secondary | ICD-10-CM | POA: Diagnosis not present

## 2023-05-09 DIAGNOSIS — E782 Mixed hyperlipidemia: Secondary | ICD-10-CM | POA: Diagnosis not present

## 2023-05-09 DIAGNOSIS — Z0000 Encounter for general adult medical examination without abnormal findings: Secondary | ICD-10-CM | POA: Diagnosis not present

## 2023-05-09 DIAGNOSIS — D649 Anemia, unspecified: Secondary | ICD-10-CM | POA: Diagnosis not present

## 2023-05-09 DIAGNOSIS — N184 Chronic kidney disease, stage 4 (severe): Secondary | ICD-10-CM | POA: Diagnosis not present

## 2023-05-09 DIAGNOSIS — I129 Hypertensive chronic kidney disease with stage 1 through stage 4 chronic kidney disease, or unspecified chronic kidney disease: Secondary | ICD-10-CM | POA: Diagnosis not present

## 2023-05-09 DIAGNOSIS — E039 Hypothyroidism, unspecified: Secondary | ICD-10-CM | POA: Diagnosis not present

## 2023-05-09 DIAGNOSIS — E1122 Type 2 diabetes mellitus with diabetic chronic kidney disease: Secondary | ICD-10-CM | POA: Diagnosis not present

## 2023-05-09 DIAGNOSIS — I10 Essential (primary) hypertension: Secondary | ICD-10-CM | POA: Diagnosis not present

## 2023-05-12 DIAGNOSIS — D692 Other nonthrombocytopenic purpura: Secondary | ICD-10-CM | POA: Diagnosis not present

## 2023-05-12 DIAGNOSIS — E2839 Other primary ovarian failure: Secondary | ICD-10-CM | POA: Diagnosis not present

## 2023-05-12 DIAGNOSIS — G9520 Unspecified cord compression: Secondary | ICD-10-CM | POA: Diagnosis not present

## 2023-05-12 DIAGNOSIS — K219 Gastro-esophageal reflux disease without esophagitis: Secondary | ICD-10-CM | POA: Diagnosis not present

## 2023-05-12 DIAGNOSIS — I509 Heart failure, unspecified: Secondary | ICD-10-CM | POA: Diagnosis not present

## 2023-05-12 DIAGNOSIS — F419 Anxiety disorder, unspecified: Secondary | ICD-10-CM | POA: Diagnosis not present

## 2023-05-12 DIAGNOSIS — M150 Primary generalized (osteo)arthritis: Secondary | ICD-10-CM | POA: Diagnosis not present

## 2023-05-12 DIAGNOSIS — I130 Hypertensive heart and chronic kidney disease with heart failure and stage 1 through stage 4 chronic kidney disease, or unspecified chronic kidney disease: Secondary | ICD-10-CM | POA: Diagnosis not present

## 2023-05-12 DIAGNOSIS — M47812 Spondylosis without myelopathy or radiculopathy, cervical region: Secondary | ICD-10-CM | POA: Diagnosis not present

## 2023-05-12 DIAGNOSIS — F3342 Major depressive disorder, recurrent, in full remission: Secondary | ICD-10-CM | POA: Diagnosis not present

## 2023-05-12 DIAGNOSIS — D631 Anemia in chronic kidney disease: Secondary | ICD-10-CM | POA: Diagnosis not present

## 2023-05-12 DIAGNOSIS — I422 Other hypertrophic cardiomyopathy: Secondary | ICD-10-CM | POA: Diagnosis not present

## 2023-05-12 DIAGNOSIS — M4802 Spinal stenosis, cervical region: Secondary | ICD-10-CM | POA: Diagnosis not present

## 2023-05-12 DIAGNOSIS — N184 Chronic kidney disease, stage 4 (severe): Secondary | ICD-10-CM | POA: Diagnosis not present

## 2023-05-12 DIAGNOSIS — E039 Hypothyroidism, unspecified: Secondary | ICD-10-CM | POA: Diagnosis not present

## 2023-05-12 DIAGNOSIS — E1122 Type 2 diabetes mellitus with diabetic chronic kidney disease: Secondary | ICD-10-CM | POA: Diagnosis not present

## 2023-05-19 DIAGNOSIS — N184 Chronic kidney disease, stage 4 (severe): Secondary | ICD-10-CM | POA: Diagnosis not present

## 2023-05-19 DIAGNOSIS — E2839 Other primary ovarian failure: Secondary | ICD-10-CM | POA: Diagnosis not present

## 2023-05-19 DIAGNOSIS — K219 Gastro-esophageal reflux disease without esophagitis: Secondary | ICD-10-CM | POA: Diagnosis not present

## 2023-05-19 DIAGNOSIS — D692 Other nonthrombocytopenic purpura: Secondary | ICD-10-CM | POA: Diagnosis not present

## 2023-05-19 DIAGNOSIS — G9520 Unspecified cord compression: Secondary | ICD-10-CM | POA: Diagnosis not present

## 2023-05-19 DIAGNOSIS — F419 Anxiety disorder, unspecified: Secondary | ICD-10-CM | POA: Diagnosis not present

## 2023-05-19 DIAGNOSIS — I509 Heart failure, unspecified: Secondary | ICD-10-CM | POA: Diagnosis not present

## 2023-05-19 DIAGNOSIS — E039 Hypothyroidism, unspecified: Secondary | ICD-10-CM | POA: Diagnosis not present

## 2023-05-19 DIAGNOSIS — I130 Hypertensive heart and chronic kidney disease with heart failure and stage 1 through stage 4 chronic kidney disease, or unspecified chronic kidney disease: Secondary | ICD-10-CM | POA: Diagnosis not present

## 2023-05-19 DIAGNOSIS — I422 Other hypertrophic cardiomyopathy: Secondary | ICD-10-CM | POA: Diagnosis not present

## 2023-05-19 DIAGNOSIS — D631 Anemia in chronic kidney disease: Secondary | ICD-10-CM | POA: Diagnosis not present

## 2023-05-19 DIAGNOSIS — F3342 Major depressive disorder, recurrent, in full remission: Secondary | ICD-10-CM | POA: Diagnosis not present

## 2023-05-19 DIAGNOSIS — M150 Primary generalized (osteo)arthritis: Secondary | ICD-10-CM | POA: Diagnosis not present

## 2023-05-19 DIAGNOSIS — M47812 Spondylosis without myelopathy or radiculopathy, cervical region: Secondary | ICD-10-CM | POA: Diagnosis not present

## 2023-05-19 DIAGNOSIS — E1122 Type 2 diabetes mellitus with diabetic chronic kidney disease: Secondary | ICD-10-CM | POA: Diagnosis not present

## 2023-05-19 DIAGNOSIS — M4802 Spinal stenosis, cervical region: Secondary | ICD-10-CM | POA: Diagnosis not present

## 2023-05-24 DIAGNOSIS — E2839 Other primary ovarian failure: Secondary | ICD-10-CM | POA: Diagnosis not present

## 2023-05-24 DIAGNOSIS — N184 Chronic kidney disease, stage 4 (severe): Secondary | ICD-10-CM | POA: Diagnosis not present

## 2023-05-24 DIAGNOSIS — I130 Hypertensive heart and chronic kidney disease with heart failure and stage 1 through stage 4 chronic kidney disease, or unspecified chronic kidney disease: Secondary | ICD-10-CM | POA: Diagnosis not present

## 2023-05-24 DIAGNOSIS — F419 Anxiety disorder, unspecified: Secondary | ICD-10-CM | POA: Diagnosis not present

## 2023-05-24 DIAGNOSIS — G9520 Unspecified cord compression: Secondary | ICD-10-CM | POA: Diagnosis not present

## 2023-05-24 DIAGNOSIS — D631 Anemia in chronic kidney disease: Secondary | ICD-10-CM | POA: Diagnosis not present

## 2023-05-24 DIAGNOSIS — F3342 Major depressive disorder, recurrent, in full remission: Secondary | ICD-10-CM | POA: Diagnosis not present

## 2023-05-24 DIAGNOSIS — D692 Other nonthrombocytopenic purpura: Secondary | ICD-10-CM | POA: Diagnosis not present

## 2023-05-24 DIAGNOSIS — M4802 Spinal stenosis, cervical region: Secondary | ICD-10-CM | POA: Diagnosis not present

## 2023-05-24 DIAGNOSIS — K219 Gastro-esophageal reflux disease without esophagitis: Secondary | ICD-10-CM | POA: Diagnosis not present

## 2023-05-24 DIAGNOSIS — E039 Hypothyroidism, unspecified: Secondary | ICD-10-CM | POA: Diagnosis not present

## 2023-05-24 DIAGNOSIS — I509 Heart failure, unspecified: Secondary | ICD-10-CM | POA: Diagnosis not present

## 2023-05-24 DIAGNOSIS — E1122 Type 2 diabetes mellitus with diabetic chronic kidney disease: Secondary | ICD-10-CM | POA: Diagnosis not present

## 2023-05-24 DIAGNOSIS — M47812 Spondylosis without myelopathy or radiculopathy, cervical region: Secondary | ICD-10-CM | POA: Diagnosis not present

## 2023-05-24 DIAGNOSIS — I422 Other hypertrophic cardiomyopathy: Secondary | ICD-10-CM | POA: Diagnosis not present

## 2023-05-24 DIAGNOSIS — M150 Primary generalized (osteo)arthritis: Secondary | ICD-10-CM | POA: Diagnosis not present

## 2023-05-27 DIAGNOSIS — E1122 Type 2 diabetes mellitus with diabetic chronic kidney disease: Secondary | ICD-10-CM | POA: Diagnosis not present

## 2023-05-27 DIAGNOSIS — N184 Chronic kidney disease, stage 4 (severe): Secondary | ICD-10-CM | POA: Diagnosis not present

## 2023-05-27 DIAGNOSIS — I509 Heart failure, unspecified: Secondary | ICD-10-CM | POA: Diagnosis not present

## 2023-05-27 DIAGNOSIS — I130 Hypertensive heart and chronic kidney disease with heart failure and stage 1 through stage 4 chronic kidney disease, or unspecified chronic kidney disease: Secondary | ICD-10-CM | POA: Diagnosis not present

## 2023-06-19 DIAGNOSIS — I10 Essential (primary) hypertension: Secondary | ICD-10-CM | POA: Diagnosis not present

## 2023-07-14 IMAGING — CR DG CERVICAL SPINE 2 OR 3 VIEWS
3 series · 3 of 3 positions shown · non-contrast
Comparison: None Available.

CLINICAL DATA: Cervicalgia. Right-sided neck pain for several
weeks.

EXAM:
CERVICAL SPINE - 2-3 VIEW

[w c-spine lat]
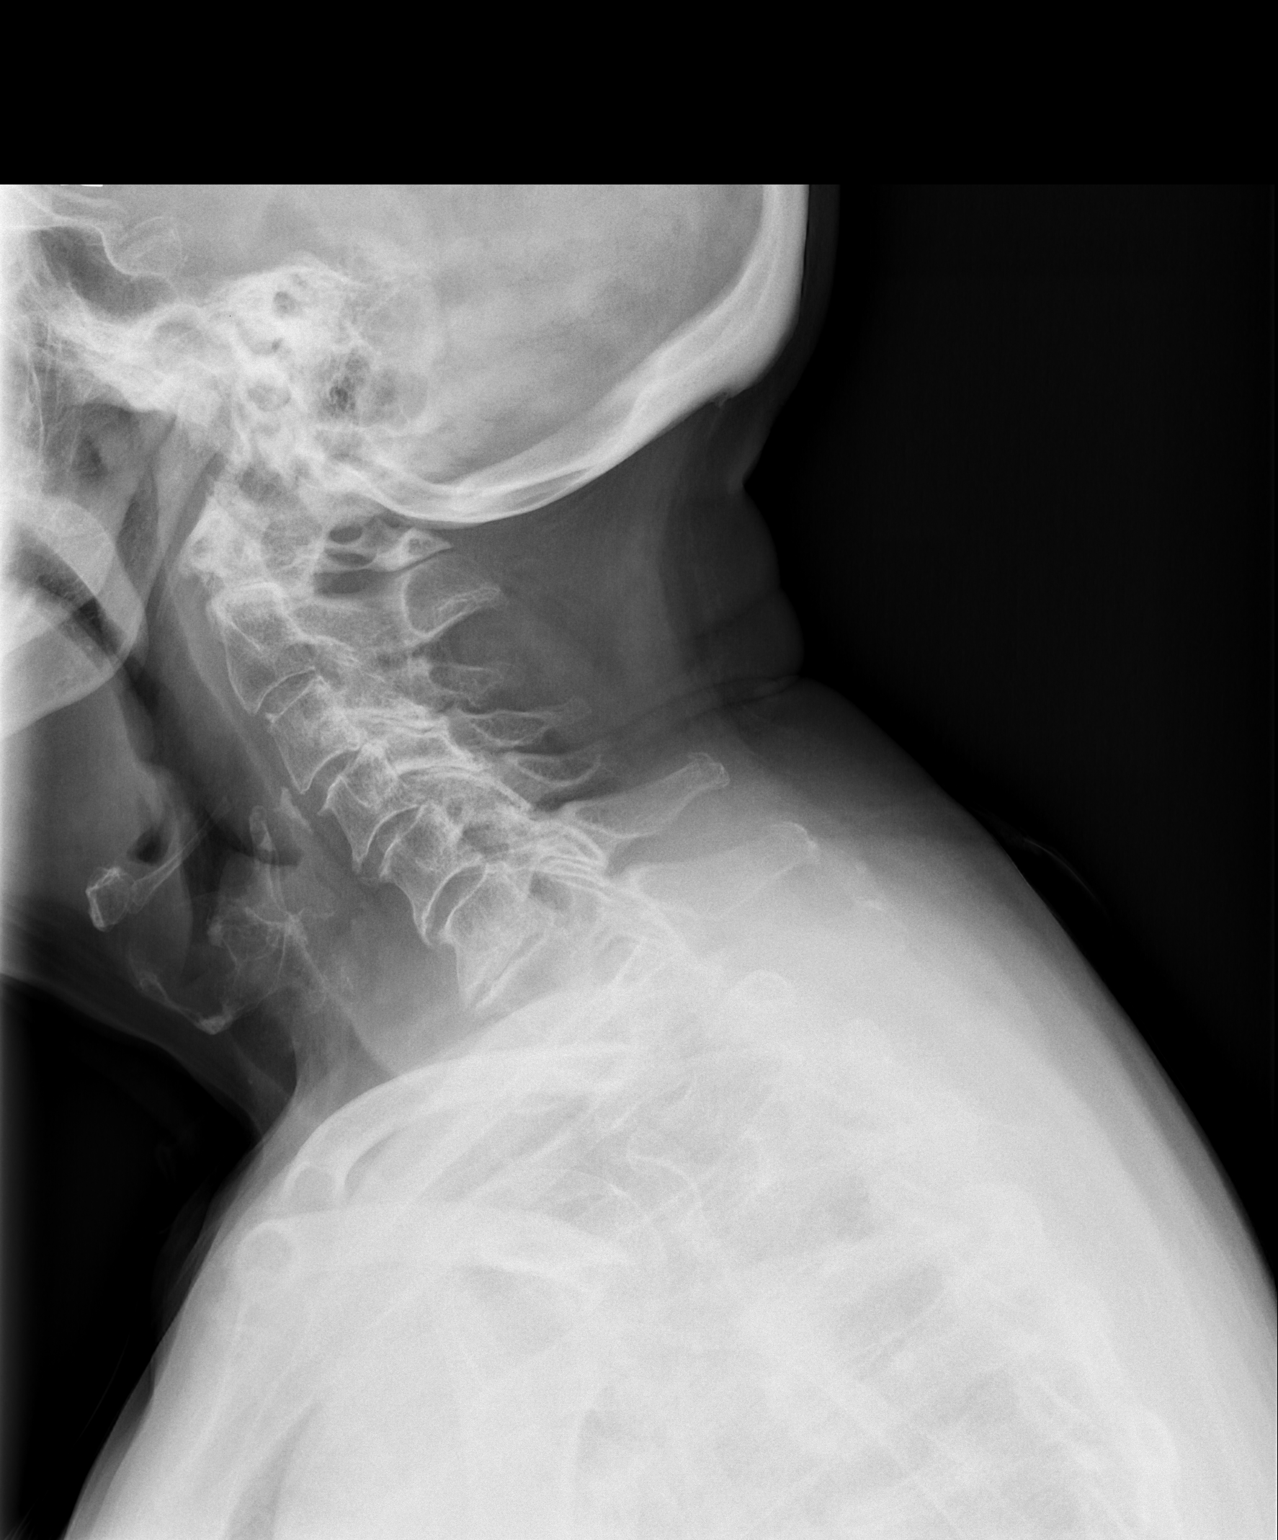

[w c-spine a.p. *]
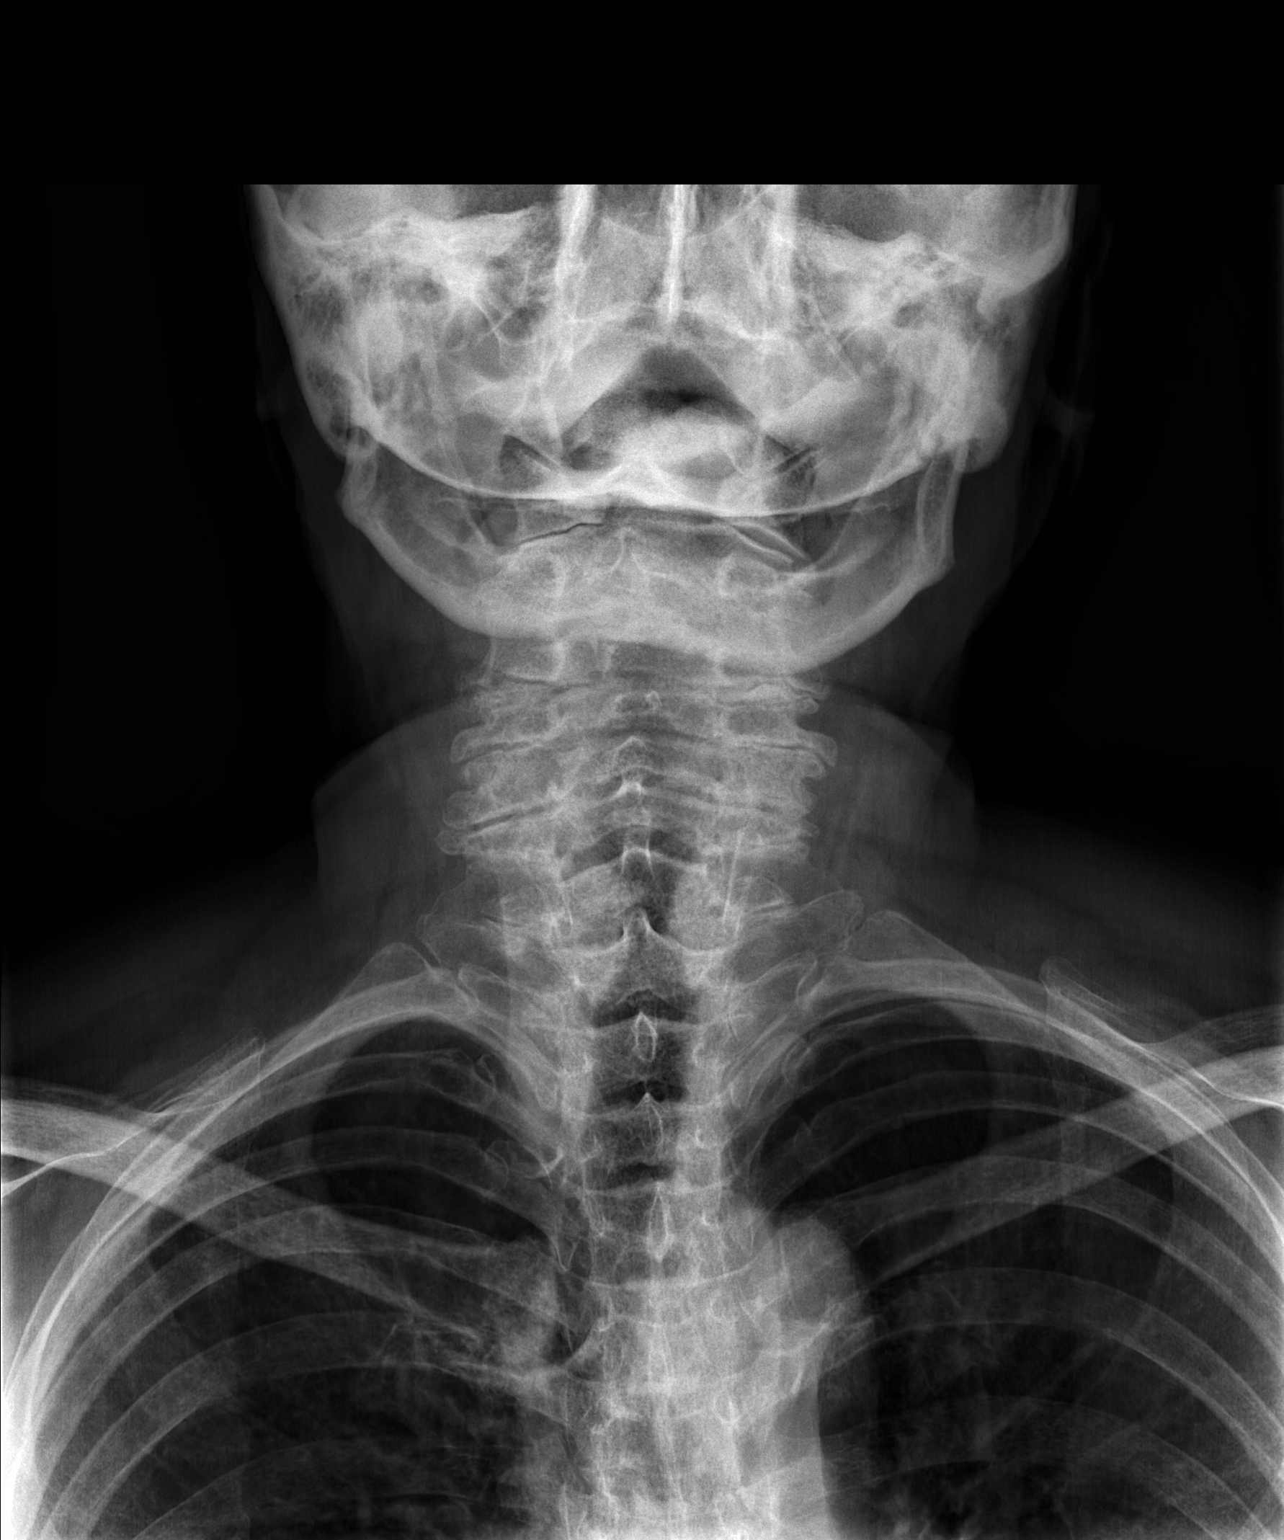

[w c-spine odontoid *]
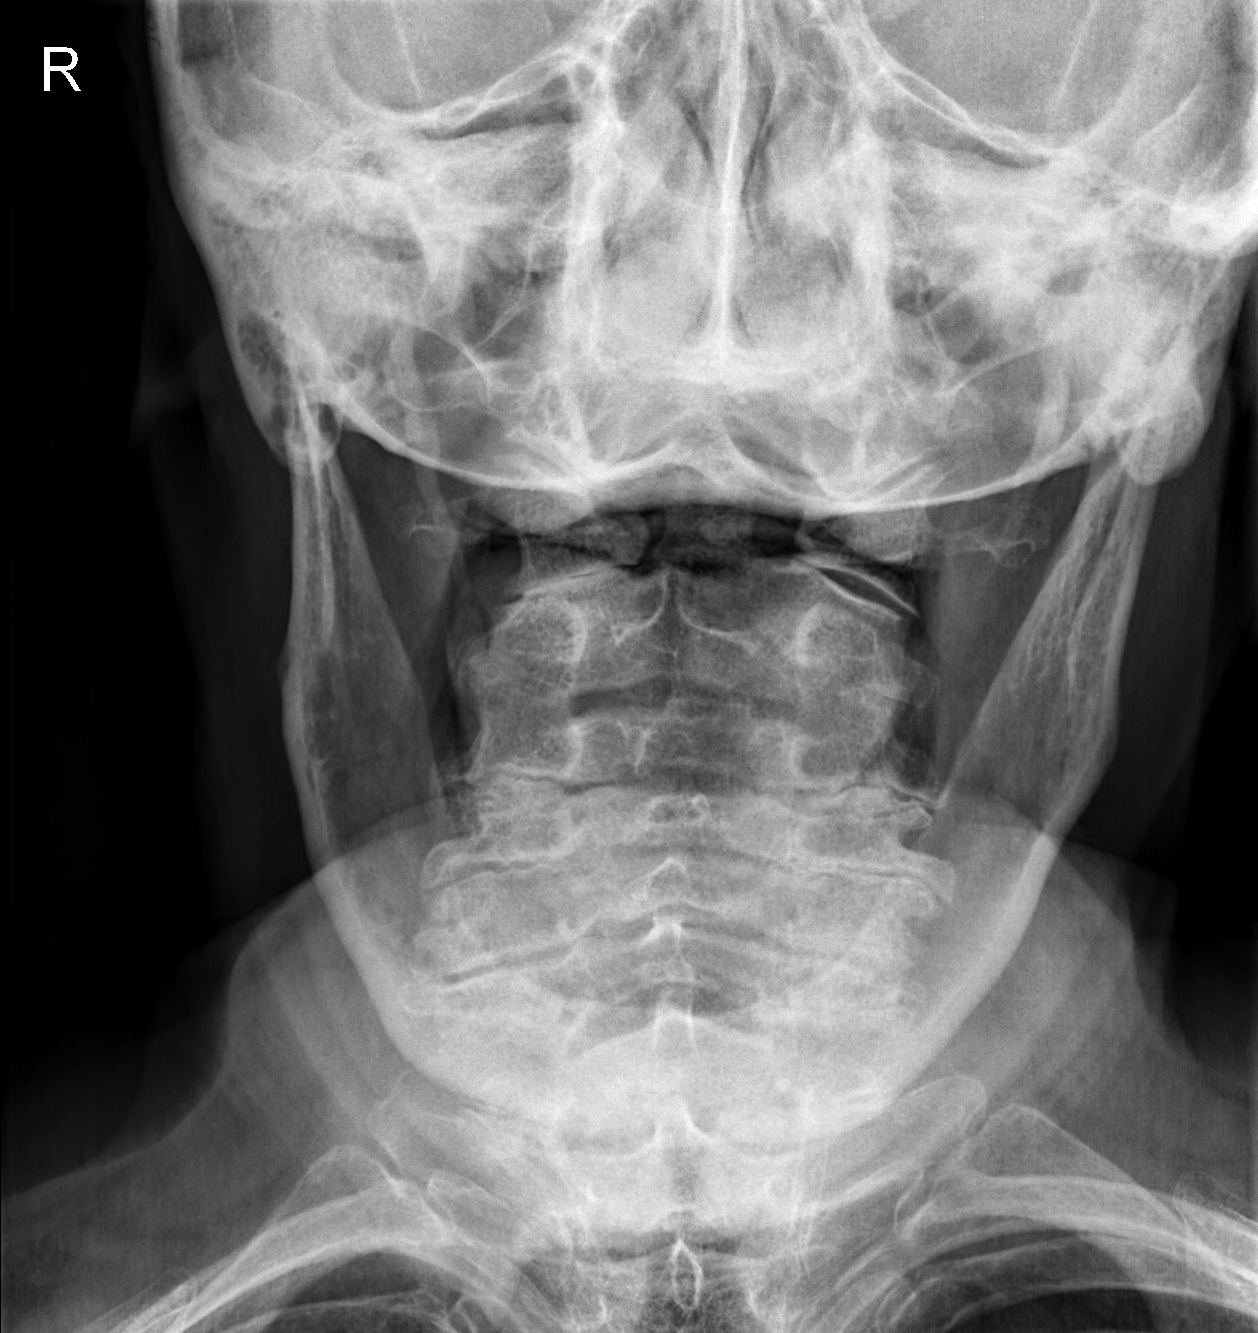

[3 of 3 positions shown; findings below may reference images not displayed]

FINDINGS: Straightening of normal lordosis. There may be trace anterolisthesis
of C3 on C4. Diffuse degenerative disc disease with disc space
narrowing and spurring from C3-C4 through C6-C7, most prominent at
C6-C7. There is moderate multilevel facet hypertrophy. No evidence
of fracture or focal bone abnormality. C1-C2 degenerative change. No
prevertebral soft tissue thickening. Lung apices are clear.
IMPRESSION: 1. Multilevel degenerative disc disease and facet hypertrophy
throughout the cervical spine, most prominent at C6-C7.
2. Straightening of normal lordosis. Trace anterolisthesis of C3 on
C4.

## 2023-07-16 DIAGNOSIS — Z79891 Long term (current) use of opiate analgesic: Secondary | ICD-10-CM | POA: Diagnosis not present

## 2023-07-16 DIAGNOSIS — M47812 Spondylosis without myelopathy or radiculopathy, cervical region: Secondary | ICD-10-CM | POA: Diagnosis not present

## 2023-09-08 DIAGNOSIS — R54 Age-related physical debility: Secondary | ICD-10-CM | POA: Diagnosis not present

## 2023-09-08 DIAGNOSIS — I129 Hypertensive chronic kidney disease with stage 1 through stage 4 chronic kidney disease, or unspecified chronic kidney disease: Secondary | ICD-10-CM | POA: Diagnosis not present

## 2023-09-08 DIAGNOSIS — J988 Other specified respiratory disorders: Secondary | ICD-10-CM | POA: Diagnosis not present

## 2023-09-08 DIAGNOSIS — E1169 Type 2 diabetes mellitus with other specified complication: Secondary | ICD-10-CM | POA: Diagnosis not present

## 2023-10-02 DIAGNOSIS — R1011 Right upper quadrant pain: Secondary | ICD-10-CM | POA: Diagnosis not present

## 2023-10-02 DIAGNOSIS — R195 Other fecal abnormalities: Secondary | ICD-10-CM | POA: Diagnosis not present

## 2023-10-17 DIAGNOSIS — R1011 Right upper quadrant pain: Secondary | ICD-10-CM | POA: Diagnosis not present

## 2023-11-03 DIAGNOSIS — E782 Mixed hyperlipidemia: Secondary | ICD-10-CM | POA: Diagnosis not present

## 2023-11-03 DIAGNOSIS — N184 Chronic kidney disease, stage 4 (severe): Secondary | ICD-10-CM | POA: Diagnosis not present

## 2023-11-03 DIAGNOSIS — E039 Hypothyroidism, unspecified: Secondary | ICD-10-CM | POA: Diagnosis not present

## 2023-11-03 DIAGNOSIS — E118 Type 2 diabetes mellitus with unspecified complications: Secondary | ICD-10-CM | POA: Diagnosis not present

## 2023-11-03 DIAGNOSIS — R109 Unspecified abdominal pain: Secondary | ICD-10-CM | POA: Diagnosis not present

## 2023-11-03 DIAGNOSIS — I10 Essential (primary) hypertension: Secondary | ICD-10-CM | POA: Diagnosis not present

## 2023-11-18 DIAGNOSIS — M47812 Spondylosis without myelopathy or radiculopathy, cervical region: Secondary | ICD-10-CM | POA: Diagnosis not present

## 2023-11-20 ENCOUNTER — Encounter: Payer: Self-pay | Admitting: Obstetrics and Gynecology

## 2023-11-20 NOTE — Progress Notes (Signed)
 GYNECOLOGY  VISIT   HPI: 83 y.o.   Married  Caucasian female   G2P2 with No LMP recorded. Patient is postmenopausal.   here for: New GYN- Abnormal labia?    Her husband is present for the visit today.     Right sided pain.   Had US  through [facility].  Results are not available today.   Hx diarrhea.  No constipation or accidental leakage of stool.   No urinary incontinence.  Voiding well.   From [employer].  GYNECOLOGIC HISTORY: No LMP recorded. Patient is postmenopausal. Contraception:  Hyst Menopausal hormone therapy:  n/a Last 2 paps:  Years ago per patient  History of abnormal Pap or positive HPV:  no Mammogram:  03/30/21 Breast Density Cat B, BIRADS Cat 1 neg         OB History     Gravida  2   Para  2   Term      Preterm      AB      Living  2      SAB      IAB      Ectopic      Multiple      Live Births  2        Obstetric Comments  Menses age 58, Parity age 53 , Hysterectomy age 20 for heavy menses'HRT x 15 years-stopped on 10/16/11            Patient Active Problem List   Diagnosis Date Noted   Intermediate stage nonexudative age-related macular degeneration of both eyes 02/28/2020   Posterior vitreous detachment of right eye 02/28/2020   Degenerative retinal drusen of right eye 02/28/2020   History of breast cancer 07/13/2012   Post-operative state 11/29/2011   Post-op bleeding 11/19/2011   Malignant neoplasm of left breast (HCC) 10/23/2011   Carcinoma of lower outer quadrant of breast (HCC) 10/23/2011   Chest pain 02/20/2011   HTN (hypertension) 02/20/2011    Past Medical History:  Diagnosis Date   Angina    Anxiety    Arthritis    Breast cancer (HCC) MAY 2013   LEFT BREAST   Depression    Diabetes mellitus    diet controlled   Gastroesophageal reflux disease    GERD (gastroesophageal reflux disease)    H/O hiatal hernia    Hypercholesteremia    Hypertension    Hypothyroidism    Mass    Left ear, benign    Osteoarthritis    Personal history of radiation therapy    PONV (postoperative nausea and vomiting)    S/P radiation therapy 01/07/12 - 02/10/12   LEFT BREAST / 50 Gy / 25 FRACTIONS    Past Surgical History:  Procedure Laterality Date   BACK SURGERY     2, pinched nerve   BILATERAL KNEE ARTHROSCOPY  2000, June 2007   Replacements   BREAST BIOPSY     BREAST LUMPECTOMY  11/13/11   Invasive Ductal Carcinoma. No lymphovascular Invasion; Margins Clear, 0/2  Nodes Negatie   CARDIOVASCULAR STRESS TEST  02-24-2009   EF 68%   HEMORRHOID SURGERY     JOINT REPLACEMENT     LEFT BREAST NIPPLE PUNCH BIOPSY Left 07/13/12   URTICARIAL ALLERGIC REACTION WITH FOCAL SPONGIOSIS   PARTIAL HYSTERECTOMY  age 14   Heavt Menses   TONSILLECTOMY     TONSILLECTOMY     US  ECHOCARDIOGRAPHY  02-27-2009   Est EF 55-60%    Current Outpatient Medications  Medication  Sig Dispense Refill   amLODipine  (NORVASC ) 5 MG tablet Take 1 tablet (5 mg total) by mouth daily. 30 tablet 0   buPROPion (WELLBUTRIN XL) 300 MG 24 hr tablet Take 300 mg by mouth daily.       ezetimibe (ZETIA) 10 MG tablet Take 10 mg by mouth daily.     Ferrous Sulfate (IRON PO) Take by mouth.     HYDROcodone -acetaminophen  (NORCO/VICODIN) 5-325 MG tablet Take 1 tablet by mouth daily.     ketoconazole 2%-triamcinolone 0.1% 1:2 cream mixture Apply topically.     levothyroxine (SYNTHROID, LEVOTHROID) 125 MCG tablet Take 125 mcg by mouth daily.       metoprolol succinate (TOPROL-XL) 50 MG 24 hr tablet 1 tablet Orally Once a day     nitroGLYCERIN (NITROSTAT) 0.4 MG SL tablet Place 0.4 mg under the tongue every 5 (five) minutes as needed.       omeprazole (PRILOSEC) 20 MG capsule Take 20 mg by mouth daily.       ondansetron  (ZOFRAN -ODT) 8 MG disintegrating tablet Take 8 mg by mouth every 8 (eight) hours as needed for nausea or vomiting.     rosuvastatin (CRESTOR) 5 MG tablet Take 5 mg by mouth daily.     sertraline (ZOLOFT) 100 MG tablet Take 100 mg by  mouth daily.       traZODone (DESYREL) 100 MG tablet Take 100 mg by mouth at bedtime.     No current facility-administered medications for this visit.     ALLERGIES: Morphine and codeine, Ciprofloxacin, Colesevelam, Crestor [rosuvastatin], and Cephalexin  Family History  Problem Relation Age of Onset   Diabetes Mother    Hypertension Father    Stroke Father    Prostate cancer Father    Coronary artery disease Father    Hypertension Sister    Hypertension Brother    Hypertension Brother    Lung cancer Sister     Social History   Socioeconomic History   Marital status: Married    Spouse name: Not on file   Number of children: Not on file   Years of education: Not on file   Highest education level: Not on file  Occupational History   Not on file  Tobacco Use   Smoking status: Never   Smokeless tobacco: Never  Vaping Use   Vaping status: Never Used  Substance and Sexual Activity   Alcohol use: No   Drug use: No   Sexual activity: Not Currently    Birth control/protection: Post-menopausal, Surgical    Comment: Partial hyst  Other Topics Concern   Not on file  Social History Narrative   Not on file   Social Drivers of Health   Financial Resource Strain: Not on file  Food Insecurity: Not on file  Transportation Needs: Not on file  Physical Activity: Not on file  Stress: Not on file  Social Connections: Not on file  Intimate Partner Violence: Not on file    Review of Systems  All other systems reviewed and are negative.   PHYSICAL EXAMINATION:   BP 128/84 (BP Location: Left Arm, Patient Position: Sitting, Cuff Size: Normal)   Pulse 64   SpO2 94%     General appearance: alert, cooperative and appears stated age   Abdomen: distended abdomen, increased muscle tone, non-tender, no masses,  no organomegaly  No abnormal inguinal nodes palpated   Pelvic: External genitalia:  no lesions              Urethra:  normal appearing urethra with no masses, tenderness  or lesions              Bartholins and Skenes: normal                 Vagina: normal appearing vagina with normal color and discharge, no lesions.  Third degree rectocele.  Good anterior and apical vaginal support.               Cervix: absent                Bimanual Exam:  Uterus:  absent              Adnexa: no mass, fullness, tenderness              Rectal exam: declined.    Chaperone was present for exam:  Cottie Diss, CMA  ASSESSMENT:  Status post hysterectomy. Abdominal pain and distention.    Rectocele. Third degree.  Hx left breast cancer.  Status post excision and XRT.   PLAN:  We discussed pelvic organ prolapse and tx options of observation versus pessary versus surgical correction.  They will consider options.  Written information given.  I discussed the importance of avoid straining and heavy lifting.  Return for pelvic ultrasound to check ovaries.   She will pursue a mammogram.   30 min  total time was spent for this patient encounter, including preparation, face-to-face counseling with the patient, coordination of care, and documentation of the encounter.

## 2023-11-24 ENCOUNTER — Ambulatory Visit (INDEPENDENT_AMBULATORY_CARE_PROVIDER_SITE_OTHER): Admitting: Obstetrics and Gynecology

## 2023-11-24 ENCOUNTER — Encounter: Payer: Self-pay | Admitting: Obstetrics and Gynecology

## 2023-11-24 VITALS — BP 128/84 | HR 64

## 2023-11-24 DIAGNOSIS — R109 Unspecified abdominal pain: Secondary | ICD-10-CM

## 2023-11-24 DIAGNOSIS — N816 Rectocele: Secondary | ICD-10-CM

## 2023-11-24 DIAGNOSIS — R140 Abdominal distension (gaseous): Secondary | ICD-10-CM

## 2023-11-24 NOTE — Patient Instructions (Signed)
 Pelvic Organs That Bulge or Drop (Pelvic Organ Prolapse): What to Know  Pelvic organ prolapse is when organs in the pelvis stretch, bulge, or drop into a position that isn't normal. It happens when the muscles and tissues that support the pelvic organs become weak or stretched. There're several types of pelvic organ prolapse. They are: Prolapse of the vagina. Prolapse of the uterus. Prolapse of the bladder. Prolapse of the rectum (rectocele). Prolapse of the intestines (enterocele). When organs other than the vagina are involved, they often bulge into the vagina or stick out from the vagina. How bad the prolapse is depends on how much the organs stick out. What are the causes? Pregnancy, labor, and giving birth to a baby. Past pelvic floor injury or surgery. Menopause. This is a stage in life when a female no longer has a period. Genetic diseases that weaken muscles and tissue. Having obesity. Long-term trouble pooping (chronic constipation). Hysterectomy. This is when the uterus is taken out. Smoking, lung disease, and long-term cough. Heavy lifting. Heavy exercises that put stress on bones and joints. What are the signs or symptoms? Leaking a little pee when you cough, sneeze, strain, or exercise. This is called stress incontinence. This may be worse right after giving birth to a baby. It may get better over time. Feeling pressure in your pelvis or vagina. You may feel more pressure when you cough or poop. A bulge that sticks out from the opening of your vagina or in the vagina. Trouble peeing or pooping. Pain in your lower back. Pain, discomfort, or leaking pee during sex. Having more than one bladder infection, also called urinary tract infection. Some people have no symptoms. How is this diagnosed? A prolapse may be diagnosed based on an exam of the vagina or the butt. During the exam, you may be asked to cough and strain while you are lying down, sitting, and standing up.   Bladder function tests may also be done. How is this treated? Treatment for pelvic organ prolapse may depend on your symptoms. Treatment may include: Lifestyle changes, such as drinking plenty of fluids and eating foods that are high in fiber. This makes it easier to poop. Peeing at specific times. This is called bladder training. This can help if you leak pee. Estrogen. This may help to make your pelvic floor muscles strong. Exercises. Kegels may help to make the muscles of the pelvic floor strong and tight. Yoga and Pilates can make the muscles of the belly strong. Biofeedback. This is doing physical therapy and using a device to help you tighten the muscles of the pelvic floor. Pelvic floor stimulators. These devices help strengthen the pelvic floor muscles if you can't do Kegels. Pessary. This is a soft, flexible device that helps support the walls of the vagina. It keeps the pelvic organs in place. Surgery. This may be needed to treat really bad prolapse. Follow these instructions at home: Activity Lose weight as told. Avoid heavy lifting and straining when you exercise or work. Do not hold your breath when you exercise or lift. Limit your activities as told. Do Kegel exercises as told. General instructions Take your medicines as told. Wear a pad or adult diapers if you leak pee. If you have a pessary, take care of it as told. Contact a health care provider if: You have symptoms that get in the way of your daily activities or sex life. You have bleeding from your vagina, and the bleeding isn't from a period. You have  a fever, bad smelling discharge from your vagina, or other signs of infection. You have pain or bleeding when you pee. You have trouble pooping. You have a pessary that falls out. You have a new, low pain in your belly. Get help right away if: You can't pee. This information is not intended to replace advice given to you by your health care provider. Make sure you  discuss any questions you have with your health care provider. Document Revised: 04/14/2023 Document Reviewed: 04/14/2023 Elsevier Patient Education  2025 [employer].

## 2023-12-11 DIAGNOSIS — M47812 Spondylosis without myelopathy or radiculopathy, cervical region: Secondary | ICD-10-CM | POA: Diagnosis not present

## 2023-12-15 NOTE — Progress Notes (Unsigned)
 83 y.o. G2P2 Married Caucasian female here for breast and pelvic exam.   Her husband is present for the visit today.   Has pelvic US  scheduled for tomorrow due to abdominal bloating.  Denies abdominal pain.   She is followed for a third degree rectocele.  She has a bowel movement about every other day.  Does not like fresh fruits and vegetables.   PCP: Auston Opal, DO   No LMP recorded. Patient is postmenopausal.           Sexually active: No.  The current method of family planning is status post hysterectomy.    Menopausal hormone therapy:  n/a Exercising:  in wheelchair.  Smoker:  no  OB History  Gravida Para Term Preterm AB Living  2 2    2   SAB IAB Ectopic Multiple Live Births      2    # Outcome Date GA Lbr Len/2nd Weight Sex Type Anes PTL Lv  2 Para           1 Para             Obstetric Comments  Menses age 77, Parity age 43 , Hysterectomy age 57 for heavy menses'HRT x 15 years-stopped on 10/16/11     HEALTH MAINTENANCE: Last 2 paps:  Years ago per pt  History of abnormal Pap or positive HPV:  no Mammogram:   03/30/21 Breast Density cat B, BIRADS Cat 1 neg  Colonoscopy:  n/a Bone Density:  years ago  Result  normal    There is no immunization history on file for this patient.    reports that she has never smoked. She has never used smokeless tobacco. She reports that she does not drink alcohol and does not use drugs.  Past Medical History:  Diagnosis Date   Angina    Anxiety    Arthritis    Breast cancer (HCC) MAY 2013   LEFT BREAST   Depression    Diabetes mellitus    diet controlled   Gastroesophageal reflux disease    GERD (gastroesophageal reflux disease)    H/O hiatal hernia    Hypercholesteremia    Hypertension    Hypothyroidism    Mass    Left ear, benign   Osteoarthritis    Personal history of radiation therapy    PONV (postoperative nausea and vomiting)    S/P radiation therapy 01/07/12 - 02/10/12   LEFT BREAST / 50 Gy / 25  FRACTIONS    Past Surgical History:  Procedure Laterality Date   BACK SURGERY     2, pinched nerve   BILATERAL KNEE ARTHROSCOPY  2000, June 2007   Replacements   BREAST BIOPSY     BREAST LUMPECTOMY  11/13/11   Invasive Ductal Carcinoma. No lymphovascular Invasion; Margins Clear, 0/2  Nodes Negatie   CARDIOVASCULAR STRESS TEST  02-24-2009   EF 68%   HEMORRHOID SURGERY     JOINT REPLACEMENT     LEFT BREAST NIPPLE PUNCH BIOPSY Left 07/13/12   URTICARIAL ALLERGIC REACTION WITH FOCAL SPONGIOSIS   PARTIAL HYSTERECTOMY  age 20   Heavt Menses   TONSILLECTOMY     TONSILLECTOMY     US  ECHOCARDIOGRAPHY  02-27-2009   Est EF 55-60%    Current Outpatient Medications  Medication Sig Dispense Refill   amLODipine  (NORVASC ) 5 MG tablet Take 1 tablet (5 mg total) by mouth daily. 30 tablet 0   amLODipine -benazepril (LOTREL) 10-40 MG capsule as directed Orally once daily  buPROPion (WELLBUTRIN XL) 300 MG 24 hr tablet Take 300 mg by mouth daily.       ezetimibe (ZETIA) 10 MG tablet Take 10 mg by mouth daily.     Ferric Maltol (ACCRUFER) 30 MG CAPS 1 capsule 1 hour before or 2 hours after meals Orally Once a day     Ferrous Sulfate (IRON PO) Take by mouth.     ketoconazole 2%-triamcinolone 0.1% 1:2 cream mixture Apply topically.     levothyroxine (SYNTHROID, LEVOTHROID) 125 MCG tablet Take 125 mcg by mouth daily.       meloxicam (MOBIC) 7.5 MG tablet 1 tablet Orally Once a day; Duration: 30 days     metoprolol succinate (TOPROL-XL) 50 MG 24 hr tablet 1 tablet Orally Once a day     nitroGLYCERIN (NITROSTAT) 0.4 MG SL tablet Place 0.4 mg under the tongue every 5 (five) minutes as needed.       omeprazole (PRILOSEC) 20 MG capsule Take 20 mg by mouth daily.       rosuvastatin (CRESTOR) 5 MG tablet Take 5 mg by mouth daily.     sertraline (ZOLOFT) 100 MG tablet Take 100 mg by mouth daily.       traZODone (DESYREL) 100 MG tablet Take 100 mg by mouth at bedtime.     No current facility-administered  medications for this visit.    ALLERGIES: Morphine and codeine, Ciprofloxacin, Colesevelam, Crestor [rosuvastatin], and Cephalexin  Family History  Problem Relation Age of Onset   Diabetes Mother    Hypertension Father    Stroke Father    Prostate cancer Father    Coronary artery disease Father    Hypertension Sister    Hypertension Brother    Hypertension Brother    Lung cancer Sister     Review of Systems  All other systems reviewed and are negative.   PHYSICAL EXAM:  BP 124/78 (BP Location: Left Arm, Patient Position: Sitting)   Pulse 67   SpO2 96%     General appearance: alert, cooperative and appears stated age   Breasts: right - normal appearance, no masses or tenderness, No nipple retraction or dimpling, No nipple discharge or bleeding, No axillary adenopathy Left - radiation changes noted, no masses or tenderness, No nipple retraction or dimpling, No nipple discharge or bleeding, No axillary adenopathy Abdomen: soft, distended, non-tender; no masses, no organomegaly   Pelvic: External genitalia:  no lesions              No abnormal inguinal nodes palpated.              Urethra:  normal appearing urethra with no masses, tenderness or lesions              Bartholins and Skenes: normal                 Vagina: normal appearing vagina with normal color and discharge, no lesions.  Third degree rectocele.               Cervix: absent              Pap taken: no.  Bimanual Exam:  Uterus:  absent              Adnexa: no mass, fullness, tenderness              Rectal exam: yes.  Confirms.              Anus:  normal sphincter tone, no lesions  Chaperone was present for exam:  Kari HERO, CMA  ASSESSMENT: Well woman visit with gynecologic exam. Status post hysterectomy. Abdominal distention.    Rectocele. Third degree.  Hx left breast cancer.  Status post excision and XRT.   PLAN: Mammogram screening discussed.  Information given to schedule this appointment at the [facility].   Order placed.  Self breast awareness reviewed. Pap and HRV collected:  no.  Not indicated.  Colace 100 mg at bedtime recommended. Avoid straining to have bowel movements.  Return for pelvic US  appointment tomorrow.  Follow up:  every 2 years and prn.    Additional counseling given.  yes. 20 min  total time was spent for this patient encounter, including preparation, face-to-face counseling with the patient, coordination of care, and documentation of the encounter in addition to doing the well woman visit with gynecologic exam.

## 2023-12-16 ENCOUNTER — Ambulatory Visit (INDEPENDENT_AMBULATORY_CARE_PROVIDER_SITE_OTHER): Admitting: Obstetrics and Gynecology

## 2023-12-16 ENCOUNTER — Encounter: Payer: Self-pay | Admitting: Obstetrics and Gynecology

## 2023-12-16 VITALS — BP 124/78 | HR 67

## 2023-12-16 DIAGNOSIS — K5900 Constipation, unspecified: Secondary | ICD-10-CM

## 2023-12-16 DIAGNOSIS — Z01419 Encounter for gynecological examination (general) (routine) without abnormal findings: Secondary | ICD-10-CM | POA: Diagnosis not present

## 2023-12-16 DIAGNOSIS — Z853 Personal history of malignant neoplasm of breast: Secondary | ICD-10-CM | POA: Diagnosis not present

## 2023-12-16 DIAGNOSIS — R140 Abdominal distension (gaseous): Secondary | ICD-10-CM

## 2023-12-16 DIAGNOSIS — Z1231 Encounter for screening mammogram for malignant neoplasm of breast: Secondary | ICD-10-CM

## 2023-12-16 NOTE — Progress Notes (Unsigned)
 GYNECOLOGY  VISIT   HPI: 83 y.o.   Married  Caucasian female   G2P2 with No LMP recorded. Patient is postmenopausal.   here for: u/s consult for abdominal bloating.   Husband is present for the visit today.     GYNECOLOGIC HISTORY: No LMP recorded. Patient is postmenopausal. Contraception:  PMP, hyst  Menopausal hormone therapy:  n/a Last 2 paps:  years ago  History of abnormal Pap or positive HPV:  no Mammogram:  03/30/21 Breast Density cat B, BIRADS Cat 1 neg         OB History     Gravida  2   Para  2   Term      Preterm      AB      Living  2      SAB      IAB      Ectopic      Multiple      Live Births  2        Obstetric Comments  Menses age 14, Parity age 39 , Hysterectomy age 85 for heavy menses'HRT x 15 years-stopped on 10/16/11            Patient Active Problem List   Diagnosis Date Noted   Intermediate stage nonexudative age-related macular degeneration of both eyes 02/28/2020   Posterior vitreous detachment of right eye 02/28/2020   Degenerative retinal drusen of right eye 02/28/2020   History of breast cancer 07/13/2012   Post-operative state 11/29/2011   Post-op bleeding 11/19/2011   Malignant neoplasm of left breast (HCC) 10/23/2011   Carcinoma of lower outer quadrant of breast (HCC) 10/23/2011   Chest pain 02/20/2011   HTN (hypertension) 02/20/2011    Past Medical History:  Diagnosis Date   Angina    Anxiety    Arthritis    Breast cancer (HCC) MAY 2013   LEFT BREAST   Depression    Diabetes mellitus    diet controlled   Gastroesophageal reflux disease    GERD (gastroesophageal reflux disease)    H/O hiatal hernia    Hypercholesteremia    Hypertension    Hypothyroidism    Mass    Left ear, benign   Osteoarthritis    Personal history of radiation therapy    PONV (postoperative nausea and vomiting)    S/P radiation therapy 01/07/12 - 02/10/12   LEFT BREAST / 50 Gy / 25 FRACTIONS    Past Surgical History:   Procedure Laterality Date   BACK SURGERY     2, pinched nerve   BILATERAL KNEE ARTHROSCOPY  2000, June 2007   Replacements   BREAST BIOPSY     BREAST LUMPECTOMY  11/13/11   Invasive Ductal Carcinoma. No lymphovascular Invasion; Margins Clear, 0/2  Nodes Negatie   CARDIOVASCULAR STRESS TEST  02-24-2009   EF 68%   HEMORRHOID SURGERY     JOINT REPLACEMENT     LEFT BREAST NIPPLE PUNCH BIOPSY Left 07/13/12   URTICARIAL ALLERGIC REACTION WITH FOCAL SPONGIOSIS   PARTIAL HYSTERECTOMY  age 83   Heavt Menses   TONSILLECTOMY     TONSILLECTOMY     US  ECHOCARDIOGRAPHY  02-27-2009   Est EF 55-60%    Current Outpatient Medications  Medication Sig Dispense Refill   amLODipine  (NORVASC ) 5 MG tablet Take 1 tablet (5 mg total) by mouth daily. 30 tablet 0   amLODipine -benazepril (LOTREL) 10-40 MG capsule as directed Orally once daily     buPROPion (WELLBUTRIN XL) 300  MG 24 hr tablet Take 300 mg by mouth daily.       ezetimibe (ZETIA) 10 MG tablet Take 10 mg by mouth daily.     Ferric Maltol (ACCRUFER) 30 MG CAPS 1 capsule 1 hour before or 2 hours after meals Orally Once a day     Ferrous Sulfate (IRON PO) Take by mouth.     ketoconazole 2%-triamcinolone 0.1% 1:2 cream mixture Apply topically.     levothyroxine (SYNTHROID, LEVOTHROID) 125 MCG tablet Take 125 mcg by mouth daily.       meloxicam (MOBIC) 7.5 MG tablet 1 tablet Orally Once a day; Duration: 30 days     metoprolol succinate (TOPROL-XL) 50 MG 24 hr tablet 1 tablet Orally Once a day     nitroGLYCERIN (NITROSTAT) 0.4 MG SL tablet Place 0.4 mg under the tongue every 5 (five) minutes as needed.       omeprazole (PRILOSEC) 20 MG capsule Take 20 mg by mouth daily.       rosuvastatin (CRESTOR) 5 MG tablet Take 5 mg by mouth daily.     sertraline (ZOLOFT) 100 MG tablet Take 100 mg by mouth daily.       traZODone (DESYREL) 100 MG tablet Take 100 mg by mouth at bedtime.     No current facility-administered medications for this visit.      ALLERGIES: Morphine and codeine, Ciprofloxacin, Colesevelam, Crestor [rosuvastatin], and Cephalexin  Family History  Problem Relation Age of Onset   Diabetes Mother    Hypertension Father    Stroke Father    Prostate cancer Father    Coronary artery disease Father    Hypertension Sister    Hypertension Brother    Hypertension Brother    Lung cancer Sister     Social History   Socioeconomic History   Marital status: Married    Spouse name: Not on file   Number of children: Not on file   Years of education: Not on file   Highest education level: Not on file  Occupational History   Not on file  Tobacco Use   Smoking status: Never   Smokeless tobacco: Never  Vaping Use   Vaping status: Never Used  Substance and Sexual Activity   Alcohol use: No   Drug use: No   Sexual activity: Not Currently    Birth control/protection: Post-menopausal, Surgical    Comment: Partial hyst, Less than 5, after 15, no abnormal pap, no DES  Other Topics Concern   Not on file  Social History Narrative   Not on file   Social Drivers of Health   Financial Resource Strain: Not on file  Food Insecurity: Not on file  Transportation Needs: Not on file  Physical Activity: Not on file  Stress: Not on file  Social Connections: Not on file  Intimate Partner Violence: Not on file    Review of Systems  All other systems reviewed and are negative.   PHYSICAL EXAMINATION:   BP 124/76 (BP Location: Left Arm, Patient Position: Sitting)   Pulse 68   SpO2 98%     General appearance: alert, cooperative and appears stated age  Pelvic US  Uterus absent.  Left ovary 2.09 x 1.46 x 1.37 cm.  Normal appearance.  Normal perfusion.  Right ovary 1.79 x 1.28 x 1.20 cm.  Normal appearance.  Normal perfusion. No adnexal masses.  No free fluid.     ASSESSMENT:  Abdominal bloating.  I suspect GI source.  Normal pelvic ultrasound.   PLAN:  US  images and report reviewed.  Reassurance regarding  appearance of ovaries.  Follow up with PCP for any additional concerns.  FU for breast and pelvic exam in 2 years and prn.   10 min  total time was spent for this patient encounter, including preparation, face-to-face counseling with the patient, coordination of care, and documentation of the encounter.

## 2023-12-16 NOTE — Patient Instructions (Signed)
 Docusate Capsules or Tablets  also called Colace.  What is this medication? DOCUSATE (doc CUE sayt) prevents and treats occasional constipation. It works by softening the stool, making it easier to have a bowel movement. It belongs to a group of medications called laxatives. This medicine may be used for other purposes; ask your health care provider or pharmacist if you have questions. COMMON BRAND NAME(S): [employer] Stool Softner, Colace, Colace Clear, Correctol, D.O.S., DC, Doc-Q-Lace, DocuLace, Docusoft S, DOK, DOK Extra Strength, Dulcolax, Dulcolax Pink, Fleet, Genasoft, Kao-Tin, Kaopectate Liqui-Gels, Phillips Stool Softener, Plus PHARMA, Stool Softener, Stool Softener DC, Stool Softener Extra Strength, Sulfolax, Sur-Q-Lax, Surfak, Uni-Ease What should I tell my care team before I take this medication? They need to know if you have any of these conditions: Nausea or vomiting Severe constipation Stomach pain Sudden change in bowel habit lasting more than 2 weeks An unusual or allergic reaction to docusate, other medications, foods, dyes, or preservatives Pregnant or trying to get pregnant Breast-feeding How should I use this medication? Take this medication by mouth with a glass of water. Take it as directed on the label. Do not take it more often than directed. Talk to your care team about the use of this medication in children. While it may be prescribed for children as young as 2 years for selected conditions, precautions do apply. Overdosage: If you think you have taken too much of this medicine contact a poison control center or emergency room at once. NOTE: This medicine is only for you. Do not share this medicine with others. What if I miss a dose? If you miss a dose, take it as soon as you can. If it is almost time for your next dose, take only that dose. Do not take double or extra doses. What may interact with this medication? Mineral oil This list may not describe all possible  interactions. Give your health care provider a list of all the medicines, herbs, non-prescription drugs, or dietary supplements you use. Also tell them if you smoke, drink alcohol, or use illegal drugs. Some items may interact with your medicine. What should I watch for while using this medication? Do not use for more than one week without advice from your care team. If your constipation returns, check with your care team. Drink plenty of water while taking this medication. Drinking water helps decrease constipation. Stop using this medication and contact your care team if you experience any rectal bleeding or do not have a bowel movement after use. These could be signs of a more serious condition. What side effects may I notice from receiving this medication? Side effects that you should report to your care team as soon as possible: Allergic reactions--skin rash, itching, hives, swelling of the face, lips, tongue, or throat Side effects that usually do not require medical attention (report to your care team if they continue or are bothersome): Diarrhea Stomach pain This list may not describe all possible side effects. Call your doctor for medical advice about side effects. You may report side effects to FDA at 1-800-FDA-1088. Where should I keep my medication? Keep out of the reach of children and pets. Store at room temperature between 15 and 30 degrees C (59 and 86 degrees F). Throw away any unused medication after the expiration date. NOTE: This sheet is a summary. It may not cover all possible information. If you have questions about this medicine, talk to your doctor, pharmacist, or health care provider.  2024 Elsevier/Gold Standard (2022-01-03 00:00:00)  EXERCISE AND DIET:  We recommended that you start or continue a regular exercise program for good health. Regular exercise means any activity that makes your heart beat faster and makes you sweat.  We recommend exercising at least 30  minutes per day at least 3 days a week, preferably 4 or 5.  We also recommend a diet low in fat and sugar.  Inactivity, poor dietary choices and obesity can cause diabetes, heart attack, stroke, and kidney damage, among others.    ALCOHOL AND SMOKING:  Women should limit their alcohol intake to no more than 7 drinks/beers/glasses of wine (combined, not each!) per week. Moderation of alcohol intake to this level decreases your risk of breast cancer and liver damage. And of course, no recreational drugs are part of a healthy lifestyle.  And absolutely no smoking or even second hand smoke. Most people know smoking can cause heart and lung diseases, but did you know it also contributes to weakening of your bones? Aging of your skin?  Yellowing of your teeth and nails?  CALCIUM AND VITAMIN D:  Adequate intake of calcium and Vitamin D are recommended.  The recommendations for exact amounts of these supplements seem to change often, but generally speaking 600 mg of calcium (either carbonate or citrate) and 800 units of Vitamin D per day seems prudent. Certain women may benefit from higher intake of Vitamin D.  If you are among these women, your doctor will have told you during your visit.    PAP SMEARS:  Pap smears, to check for cervical cancer or precancers,  have traditionally been done yearly, although recent scientific advances have shown that most women can have pap smears less often.  However, every woman still should have a physical exam from her gynecologist every year. It will include a breast check, inspection of the vulva and vagina to check for abnormal growths or skin changes, a visual exam of the cervix, and then an exam to evaluate the size and shape of the uterus and ovaries.  And after 84 years of age, a rectal exam is indicated to check for rectal cancers. We will also provide age appropriate advice regarding health maintenance, like when you should have certain vaccines, screening for sexually  transmitted diseases, bone density testing, colonoscopy, mammograms, etc.   MAMMOGRAMS:  All women over 54 years old should have a yearly mammogram. Many facilities now offer a 3D mammogram, which may cost around $50 extra out of pocket. If possible,  we recommend you accept the option to have the 3D mammogram performed.  It both reduces the number of women who will be called back for extra views which then turn out to be normal, and it is better than the routine mammogram at detecting truly abnormal areas.    COLONOSCOPY:  Colonoscopy to screen for colon cancer is recommended for all women at age 71.  We know, you hate the idea of the prep.  We agree, BUT, having colon cancer and not knowing it is worse!!  Colon cancer so often starts as a polyp that can be seen and removed at colonscopy, which can quite literally save your life!  And if your first colonoscopy is normal and you have no family history of colon cancer, most women don't have to have it again for 10 years.  Once every ten years, you can do something that may end up saving your life, right?  We will be happy to help you get it scheduled when you are  ready.  Be sure to check your insurance coverage so you understand how much it will cost.  It may be covered as a preventative service at no cost, but you should check your particular policy.

## 2023-12-17 ENCOUNTER — Ambulatory Visit (INDEPENDENT_AMBULATORY_CARE_PROVIDER_SITE_OTHER): Admitting: Obstetrics and Gynecology

## 2023-12-17 ENCOUNTER — Ambulatory Visit (INDEPENDENT_AMBULATORY_CARE_PROVIDER_SITE_OTHER)

## 2023-12-17 ENCOUNTER — Encounter: Payer: Self-pay | Admitting: Obstetrics and Gynecology

## 2023-12-17 VITALS — BP 124/76 | HR 68

## 2023-12-17 DIAGNOSIS — R140 Abdominal distension (gaseous): Secondary | ICD-10-CM

## 2023-12-17 DIAGNOSIS — R109 Unspecified abdominal pain: Secondary | ICD-10-CM | POA: Diagnosis not present

## 2023-12-24 DIAGNOSIS — M47812 Spondylosis without myelopathy or radiculopathy, cervical region: Secondary | ICD-10-CM | POA: Diagnosis not present

## 2024-01-19 ENCOUNTER — Ambulatory Visit

## 2024-01-21 DIAGNOSIS — Z79891 Long term (current) use of opiate analgesic: Secondary | ICD-10-CM | POA: Diagnosis not present

## 2024-01-21 DIAGNOSIS — M47812 Spondylosis without myelopathy or radiculopathy, cervical region: Secondary | ICD-10-CM | POA: Diagnosis not present

## 2024-02-26 DIAGNOSIS — I129 Hypertensive chronic kidney disease with stage 1 through stage 4 chronic kidney disease, or unspecified chronic kidney disease: Secondary | ICD-10-CM | POA: Diagnosis not present

## 2024-02-26 DIAGNOSIS — E118 Type 2 diabetes mellitus with unspecified complications: Secondary | ICD-10-CM | POA: Diagnosis not present

## 2024-02-26 DIAGNOSIS — E1122 Type 2 diabetes mellitus with diabetic chronic kidney disease: Secondary | ICD-10-CM | POA: Diagnosis not present

## 2024-02-26 DIAGNOSIS — E1169 Type 2 diabetes mellitus with other specified complication: Secondary | ICD-10-CM | POA: Diagnosis not present

## 2024-03-03 DIAGNOSIS — M25511 Pain in right shoulder: Secondary | ICD-10-CM | POA: Diagnosis not present

## 2024-03-16 DIAGNOSIS — E039 Hypothyroidism, unspecified: Secondary | ICD-10-CM | POA: Diagnosis not present

## 2024-03-16 DIAGNOSIS — I129 Hypertensive chronic kidney disease with stage 1 through stage 4 chronic kidney disease, or unspecified chronic kidney disease: Secondary | ICD-10-CM | POA: Diagnosis not present

## 2024-03-16 DIAGNOSIS — E1169 Type 2 diabetes mellitus with other specified complication: Secondary | ICD-10-CM | POA: Diagnosis not present

## 2024-03-16 DIAGNOSIS — I509 Heart failure, unspecified: Secondary | ICD-10-CM | POA: Diagnosis not present

## 2024-03-16 DIAGNOSIS — F3342 Major depressive disorder, recurrent, in full remission: Secondary | ICD-10-CM | POA: Diagnosis not present

## 2024-03-16 DIAGNOSIS — E118 Type 2 diabetes mellitus with unspecified complications: Secondary | ICD-10-CM | POA: Diagnosis not present

## 2024-03-16 DIAGNOSIS — F411 Generalized anxiety disorder: Secondary | ICD-10-CM | POA: Diagnosis not present

## 2024-03-16 DIAGNOSIS — E1122 Type 2 diabetes mellitus with diabetic chronic kidney disease: Secondary | ICD-10-CM | POA: Diagnosis not present

## 2024-03-29 DIAGNOSIS — E1169 Type 2 diabetes mellitus with other specified complication: Secondary | ICD-10-CM | POA: Diagnosis not present

## 2024-03-29 DIAGNOSIS — I129 Hypertensive chronic kidney disease with stage 1 through stage 4 chronic kidney disease, or unspecified chronic kidney disease: Secondary | ICD-10-CM | POA: Diagnosis not present

## 2024-03-29 DIAGNOSIS — E118 Type 2 diabetes mellitus with unspecified complications: Secondary | ICD-10-CM | POA: Diagnosis not present

## 2024-03-29 DIAGNOSIS — E1122 Type 2 diabetes mellitus with diabetic chronic kidney disease: Secondary | ICD-10-CM | POA: Diagnosis not present

## 2024-03-30 DIAGNOSIS — H6123 Impacted cerumen, bilateral: Secondary | ICD-10-CM | POA: Diagnosis not present

## 2024-03-30 DIAGNOSIS — J988 Other specified respiratory disorders: Secondary | ICD-10-CM | POA: Diagnosis not present

## 2024-04-16 DIAGNOSIS — F3342 Major depressive disorder, recurrent, in full remission: Secondary | ICD-10-CM | POA: Diagnosis not present

## 2024-04-16 DIAGNOSIS — F411 Generalized anxiety disorder: Secondary | ICD-10-CM | POA: Diagnosis not present

## 2024-04-16 DIAGNOSIS — I509 Heart failure, unspecified: Secondary | ICD-10-CM | POA: Diagnosis not present

## 2024-04-16 DIAGNOSIS — E039 Hypothyroidism, unspecified: Secondary | ICD-10-CM | POA: Diagnosis not present

## 2024-04-28 DIAGNOSIS — E118 Type 2 diabetes mellitus with unspecified complications: Secondary | ICD-10-CM | POA: Diagnosis not present

## 2024-04-28 DIAGNOSIS — E1122 Type 2 diabetes mellitus with diabetic chronic kidney disease: Secondary | ICD-10-CM | POA: Diagnosis not present

## 2024-04-28 DIAGNOSIS — E1169 Type 2 diabetes mellitus with other specified complication: Secondary | ICD-10-CM | POA: Diagnosis not present

## 2024-04-28 DIAGNOSIS — I129 Hypertensive chronic kidney disease with stage 1 through stage 4 chronic kidney disease, or unspecified chronic kidney disease: Secondary | ICD-10-CM | POA: Diagnosis not present

## 2024-05-10 DIAGNOSIS — E118 Type 2 diabetes mellitus with unspecified complications: Secondary | ICD-10-CM | POA: Diagnosis not present

## 2024-05-10 DIAGNOSIS — N184 Chronic kidney disease, stage 4 (severe): Secondary | ICD-10-CM | POA: Diagnosis not present

## 2024-05-10 DIAGNOSIS — Z0000 Encounter for general adult medical examination without abnormal findings: Secondary | ICD-10-CM | POA: Diagnosis not present

## 2024-05-10 DIAGNOSIS — E039 Hypothyroidism, unspecified: Secondary | ICD-10-CM | POA: Diagnosis not present

## 2024-05-10 DIAGNOSIS — E611 Iron deficiency: Secondary | ICD-10-CM | POA: Diagnosis not present

## 2024-05-10 DIAGNOSIS — I10 Essential (primary) hypertension: Secondary | ICD-10-CM | POA: Diagnosis not present

## 2024-05-10 DIAGNOSIS — E782 Mixed hyperlipidemia: Secondary | ICD-10-CM | POA: Diagnosis not present

## 2024-05-16 DIAGNOSIS — I509 Heart failure, unspecified: Secondary | ICD-10-CM | POA: Diagnosis not present

## 2024-05-16 DIAGNOSIS — E039 Hypothyroidism, unspecified: Secondary | ICD-10-CM | POA: Diagnosis not present

## 2024-05-16 DIAGNOSIS — F411 Generalized anxiety disorder: Secondary | ICD-10-CM | POA: Diagnosis not present

## 2024-05-16 DIAGNOSIS — F3342 Major depressive disorder, recurrent, in full remission: Secondary | ICD-10-CM | POA: Diagnosis not present
